# Patient Record
Sex: Female | Born: 1990 | Race: White | Hispanic: No | Marital: Married | Smoking: Never smoker
Health system: Southern US, Community
[De-identification: ages and names within clinical notes are randomized; demographics above are authoritative.]

## PROBLEM LIST (undated history)

## (undated) DIAGNOSIS — J45909 Unspecified asthma, uncomplicated: Secondary | ICD-10-CM

## (undated) HISTORY — DX: Unspecified asthma, uncomplicated: J45.909

---
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
## 2011-03-04 ENCOUNTER — Encounter (HOSPITAL_COMMUNITY): Payer: Self-pay

## 2011-03-04 ENCOUNTER — Emergency Department (HOSPITAL_COMMUNITY): Payer: Medicaid Other

## 2011-03-04 ENCOUNTER — Emergency Department (HOSPITAL_COMMUNITY)
Admission: EM | Admit: 2011-03-04 | Discharge: 2011-03-04 | Disposition: A | Discharge status: Home / Self Care | Payer: Medicaid Other | Attending: Emergency Medicine | Admitting: Emergency Medicine

## 2011-03-04 DIAGNOSIS — T148XXA Other injury of unspecified body region, initial encounter: Secondary | ICD-10-CM | POA: Insufficient documentation

## 2011-03-04 DIAGNOSIS — Y9372 Activity, wrestling: Secondary | ICD-10-CM | POA: Insufficient documentation

## 2011-03-04 DIAGNOSIS — W219XXA Striking against or struck by unspecified sports equipment, initial encounter: Secondary | ICD-10-CM | POA: Insufficient documentation

## 2011-03-04 DIAGNOSIS — M549 Dorsalgia, unspecified: Secondary | ICD-10-CM | POA: Insufficient documentation

## 2011-03-04 MED ORDER — MELOXICAM 7.5 MG PO TABS: ORAL_TABLET | ORAL | Status: DC

## 2011-03-04 MED ORDER — METHOCARBAMOL 500 MG PO TABS: ORAL_TABLET | ORAL | Status: DC

## 2011-03-04 NOTE — ED Notes (Signed)
Wrestling w. Female friend appox. 2 hours ago, now having back pain

## 2011-03-04 NOTE — ED Notes (Signed)
Exam by Loney Laurence PA.  NAD, alert,

## 2011-04-04 NOTE — ED Provider Notes (Signed)
History     CSN: [id number]  Arrival date & time 03/04/11  1328   None     Chief Complaint  Patient presents with  . Back Pain    (Consider location/radiation/quality/duration/timing/severity/associated sxs/prior treatment) Patient is a 21 y.o. female presenting with back pain. The history is provided by the patient.  Back Pain  This is a new problem. The current episode started 1 to 2 hours ago. The problem occurs constantly. The problem has not changed since onset.Associated with: Wrestling. The pain is present in the thoracic spine. The quality of the pain is described as shooting and aching. The pain does not radiate. The pain is severe. The symptoms are aggravated by bending, twisting and certain positions. The pain is the same all the time. Pertinent negatives include no chest pain, no numbness, no abdominal pain, no bowel incontinence, no perianal numbness, no bladder incontinence, no dysuria and no paresthesias. She has tried nothing for the symptoms.    History reviewed. No pertinent past medical history.  History reviewed. No pertinent past surgical history.  No family history on file.  History  Substance Use Topics  . Smoking status: Never Smoker   . Smokeless tobacco: Not on file  . Alcohol Use: No    OB History    Grav Para Term Preterm Abortions TAB SAB Ect Mult Living                  Review of Systems  Constitutional: Negative for activity change.       All ROS Neg except as noted in HPI  HENT: Negative for nosebleeds and neck pain.   Eyes: Negative for photophobia and discharge.  Respiratory: Negative for cough, shortness of breath and wheezing.   Cardiovascular: Negative for chest pain and palpitations.  Gastrointestinal: Negative for abdominal pain, blood in stool and bowel incontinence.  Genitourinary: Negative for bladder incontinence, dysuria, frequency and hematuria.  Musculoskeletal: Positive for back pain. Negative for arthralgias.  Skin:  Negative.   Neurological: Negative for dizziness, seizures, speech difficulty, numbness and paresthesias.  Psychiatric/Behavioral: Negative for hallucinations and confusion.    Allergies  Review of patient's allergies indicates no known allergies.  Home Medications   Current Outpatient Rx  Name Route Sig Dispense Refill  . NORGESTIMATE-ETH ESTRADIOL 0.25-35 MG-MCG PO TABS Oral Take 1 tablet by mouth every evening. At 8pm    . MELOXICAM 7.5 MG PO TABS  1 po bid with food 14 tablet 0  . METHOCARBAMOL 500 MG PO TABS  2 po tid for spasm/pain 30 tablet 0    BP 127/81  Pulse 83  Temp(Src) 98.4 F (36.9 C) (Oral)  Resp 20  Ht 5\' 3"  (1.6 m)  Wt 143 lb (64.864 kg)  BMI 25.33 kg/m2  SpO2 100%  LMP 02/28/2011  Physical Exam  Nursing note and vitals reviewed. Constitutional: She is oriented to person, place, and time. She appears well-developed and well-nourished.  Non-toxic appearance.  HENT:  Head: Normocephalic.  Right Ear: Tympanic membrane and external ear normal.  Left Ear: Tympanic membrane and external ear normal.  Eyes: EOM and lids are normal. Pupils are equal, round, and reactive to light.  Neck: Normal range of motion. Neck supple. Carotid bruit is not present.  Cardiovascular: Normal rate, regular rhythm, normal heart sounds, intact distal pulses and normal pulses.   Pulmonary/Chest: Breath sounds normal. No respiratory distress.  Abdominal: Soft. Bowel sounds are normal. There is no tenderness. There is no guarding.  Musculoskeletal: Normal range  of motion.       Thoracic area pain to ROM and palpation. No palpable ddeformity  Lymphadenopathy:       Head (right side): No submandibular adenopathy present.       Head (left side): No submandibular adenopathy present.    She has no cervical adenopathy.  Neurological: She is alert and oriented to person, place, and time. She has normal strength. No cranial nerve deficit or sensory deficit. She exhibits normal muscle tone.  Coordination normal.       Gait wnl  Skin: Skin is warm and dry.  Psychiatric: She has a normal mood and affect. Her speech is normal.    ED Course  Procedures (including critical care time)  Labs Reviewed - No data to display No results found.   1. Muscle strain       MDM  I have reviewed nursing notes, vital signs, and all appropriate lab and imaging results for this patient. Thoracic spine xray neg for fracture or dislocation. No gross neuro deficit. Rx for Mobic and Robaxin given. Pt to see orthopedics is not improving.  No results found for this or any previous visit. No results found.  Medical screening examination/treatment/procedure(s) were performed by non-physician practitioner and as supervising physician I was immediately available for consultation/collaboration. Osvaldo Human, M.D.     Kathie Dike, [place] 04/04/11 1119  Carleene Cooper III, MD 04/09/11 [phone]

## 2011-08-20 ENCOUNTER — Other Ambulatory Visit: Payer: Self-pay | Admitting: Family Medicine

## 2011-08-20 ENCOUNTER — Ambulatory Visit (HOSPITAL_COMMUNITY)
Admission: RE | Admit: 2011-08-20 | Discharge: 2011-08-20 | Disposition: A | Discharge status: Home / Self Care | Payer: Medicaid Other | Source: Ambulatory Visit | Attending: Family Medicine | Admitting: Family Medicine

## 2011-08-20 DIAGNOSIS — R109 Unspecified abdominal pain: Secondary | ICD-10-CM

## 2011-08-20 DIAGNOSIS — N201 Calculus of ureter: Secondary | ICD-10-CM | POA: Insufficient documentation

## 2011-08-20 DIAGNOSIS — N1330 Unspecified hydronephrosis: Secondary | ICD-10-CM | POA: Insufficient documentation

## 2012-05-25 ENCOUNTER — Ambulatory Visit (INDEPENDENT_AMBULATORY_CARE_PROVIDER_SITE_OTHER): Payer: Medicaid Other | Admitting: Family Medicine

## 2012-05-25 ENCOUNTER — Encounter: Payer: Self-pay | Admitting: Family Medicine

## 2012-05-25 VITALS — Temp 98.4°F | Wt 167.0 lb

## 2012-05-25 DIAGNOSIS — J209 Acute bronchitis, unspecified: Secondary | ICD-10-CM

## 2012-05-25 MED ORDER — BENZONATATE 100 MG PO CAPS: 100.0000 mg | ORAL_CAPSULE | Freq: Four times a day (QID) | ORAL | Status: DC | PRN

## 2012-05-25 MED ORDER — CLARITHROMYCIN 500 MG PO TABS: 500.0000 mg | ORAL_TABLET | Freq: Two times a day (BID) | ORAL | Status: AC

## 2012-05-25 NOTE — Progress Notes (Signed)
  Subjective:    Patient ID: Kimberly Everett, female    DOB: 04-27-1990, 22 y.o.   MRN: [id number]  Cough This is a new problem. The current episode started in the past 7 days. The problem has been gradually worsening. The problem occurs every few minutes. The cough is productive of sputum. Associated symptoms include chills, nasal congestion and rhinorrhea. Pertinent negatives include no fever or headaches. Nothing aggravates the symptoms. She has tried OTC cough suppressant for the symptoms. The treatment provided no relief. Her past medical history is significant for asthma.    Diminished energy. Achy at times.  Review of Systems  Constitutional: Positive for chills. Negative for fever.  HENT: Positive for rhinorrhea.   Respiratory: Positive for cough.   Neurological: Negative for headaches.   Dec energy    Objective:   Physical Exam  Alert mild malaise. Vitals reviewed. HEENT moderate nasal congestion. Pharynx normal neck supple. Lungs some bronchial cough during exam heart regular rate and rhythm.      Assessment & Plan:  Impression rhinitis bronchitis. Plan antibiotics prescribed. Cough suppression prescribed. Symptomatic care discussed. WSL

## 2012-06-27 ENCOUNTER — Encounter: Payer: Self-pay | Admitting: *Deleted

## 2012-07-09 ENCOUNTER — Ambulatory Visit (INDEPENDENT_AMBULATORY_CARE_PROVIDER_SITE_OTHER): Payer: Medicaid Other | Admitting: Nurse Practitioner

## 2012-07-09 ENCOUNTER — Encounter: Payer: Self-pay | Admitting: Nurse Practitioner

## 2012-07-09 VITALS — BP 123/78 | HR 80 | Wt 164.8 lb

## 2012-07-09 DIAGNOSIS — F32A Depression, unspecified: Secondary | ICD-10-CM | POA: Insufficient documentation

## 2012-07-09 DIAGNOSIS — F329 Major depressive disorder, single episode, unspecified: Secondary | ICD-10-CM

## 2012-07-09 MED ORDER — ESCITALOPRAM OXALATE 20 MG PO TABS: 20.0000 mg | ORAL_TABLET | Freq: Every day | ORAL | Status: DC

## 2012-07-09 NOTE — Assessment & Plan Note (Signed)
Meds ordered this encounter  Medications  . escitalopram (LEXAPRO) 20 MG tablet    Sig: Take 1 tablet (20 mg total) by mouth daily.    Dispense:  30 tablet    Refill:  2    Order Specific Question:  Supervising Provider    Answer:  Merlyn Albert [[id number]]   Stop Celexa and switch to Lexapro. Do not take Celexa tonight, may start Lexapro tomorrow. Cautioned about potential adverse effects of changing medication. DC med and go back to Celexa and call if any severe side effects. Otherwise recheck in 3 months.

## 2012-07-09 NOTE — Progress Notes (Signed)
Subjective:  Presents for routine followup. Despite all feeding and getting regular exercise patient has slowed 20 pound weight gain since December when she started Celexa. Celexa is working well for her emotionally but is concerned about excessive weight gain.  Objective:   BP 123/78  Pulse 80  Wt 164 lb 12.8 oz (74.753 kg)  BMI 29.2 kg/m2  LMP 06/29/2012 NAD. Alert, oriented. Lungs clear. Heart regular rate rhythm.  Assessment:Depression  Plan: Meds ordered this encounter  Medications  . escitalopram (LEXAPRO) 20 MG tablet    Sig: Take 1 tablet (20 mg total) by mouth daily.    Dispense:  30 tablet    Refill:  2    Order Specific Question:  Supervising Provider    Answer:  Merlyn Albert [[id number]]   Stop Celexa and switch to Lexapro. Do not take Celexa tonight, may start Lexapro tomorrow. Cautioned about potential adverse effects of changing medication. DC med and go back to Celexa and call if any severe side effects. Otherwise recheck in 3 months.

## 2012-07-27 ENCOUNTER — Encounter: Payer: Self-pay | Admitting: Family Medicine

## 2012-07-27 ENCOUNTER — Ambulatory Visit (INDEPENDENT_AMBULATORY_CARE_PROVIDER_SITE_OTHER): Payer: Medicaid Other | Admitting: Family Medicine

## 2012-07-27 VITALS — BP 124/96 | Temp 98.3°F | Wt 166.0 lb

## 2012-07-27 DIAGNOSIS — N200 Calculus of kidney: Secondary | ICD-10-CM

## 2012-07-27 DIAGNOSIS — R300 Dysuria: Secondary | ICD-10-CM

## 2012-07-27 LAB — POCT URINALYSIS DIPSTICK
Spec Grav, UA: 1.02
pH, UA: 5

## 2012-07-27 MED ORDER — ONDANSETRON 4 MG PO TBDP
4.0000 mg | ORAL_TABLET | Freq: Three times a day (TID) | ORAL | Status: DC | PRN
Start: 2012-07-27 — End: 2012-11-11

## 2012-07-27 NOTE — Progress Notes (Signed)
  Subjective:    Patient ID: Kimberly Everett, female    DOB: 1990/05/27, 22 y.o.   MRN: [id number]  HPI Patient arrives office with left flank pain. Severe in nature. Colicky in nature. Radiates to left lateral abdomen. And somewhat to groin. Vomiting x1. Pain quite severe times. Took only Tylenol. Had no other medications. Trying to drink more liquids. Prior medical history positive for kidney stone last year. Never got the stone for a sample. No chest pain no change in bowel habits. Some dysuria no fever ROS otherwise negative   Review of Systems See above    Objective:   Physical Exam  Alert some distress. Vitals reviewed. Lungs clear. Heart regular in rhythm. Positive CVA tenderness. Positive left lateral abdominal tenderness. Good bowel sounds no rebound no guarding.  UA numerous red blood cells and occasional oxalate crystal    Assessment & Plan:

## 2012-07-27 NOTE — Patient Instructions (Addendum)
Low oxalate diet for probable calcium oxalate  Stones  Be sure to use ibuprofen three tabs three times per day

## 2012-08-31 ENCOUNTER — Encounter: Payer: Self-pay | Admitting: Family Medicine

## 2012-08-31 ENCOUNTER — Ambulatory Visit (INDEPENDENT_AMBULATORY_CARE_PROVIDER_SITE_OTHER): Payer: Managed Care, Other (non HMO) | Admitting: Family Medicine

## 2012-08-31 VITALS — BP 110/90 | HR 88 | Temp 99.1°F | Ht 63.0 in | Wt 169.6 lb

## 2012-08-31 DIAGNOSIS — N912 Amenorrhea, unspecified: Secondary | ICD-10-CM

## 2012-08-31 DIAGNOSIS — N915 Oligomenorrhea, unspecified: Secondary | ICD-10-CM

## 2012-08-31 DIAGNOSIS — R300 Dysuria: Secondary | ICD-10-CM

## 2012-08-31 MED ORDER — CIPROFLOXACIN HCL 500 MG PO TABS: 500.0000 mg | ORAL_TABLET | Freq: Two times a day (BID) | ORAL | Status: AC

## 2012-08-31 MED ORDER — ONDANSETRON HCL 8 MG PO TABS: 8.0000 mg | ORAL_TABLET | Freq: Three times a day (TID) | ORAL | Status: DC | PRN

## 2012-08-31 NOTE — Progress Notes (Signed)
  Subjective:    Patient ID: Kimberly Everett, female    DOB: 12-26-1990, 22 y.o.   MRN: [id number]  HPI Patient been feeling dizzy and nausea. Patient also states that she has been having side pain Patient relates she was diagnosed with kidney stone while back she wonders if she may have another intermittent pain and discomfort she denies vomiting diarrhea dysuria. Denies high fever or chills. PMH kidney stone. She does state that she needs refills on birth control pill. Family history benign Review of Systems See above    Objective:   Physical Exam Lungs are clear hearts regular pulse normal blood pressure is good Abdomen is soft with some subjective discomfort on the left side no guarding or rebound  Urinalysis difficult to interpret do to use of Pyridium    Assessment & Plan:  Left side abdominal discomfort urinary symptoms culture urine, Cipro 7 days as directed, if high fevers vomiting or worse followup immediately.

## 2012-09-25 ENCOUNTER — Other Ambulatory Visit: Payer: Self-pay | Admitting: Nurse Practitioner

## 2012-09-25 ENCOUNTER — Telehealth: Payer: Self-pay | Admitting: Nurse Practitioner

## 2012-09-25 MED ORDER — ESCITALOPRAM OXALATE 20 MG PO TABS: 20.0000 mg | ORAL_TABLET | Freq: Every day | ORAL | Status: DC

## 2012-09-25 NOTE — Telephone Encounter (Signed)
Pt would like a refill on her    escitalopram (LEXAPRO) 20 MG tablet   She states she if feeling good on this meds and has a follow up appt on the 15th of Sept.   [facility] [place]

## 2012-10-12 ENCOUNTER — Encounter: Payer: Self-pay | Admitting: Nurse Practitioner

## 2012-10-12 ENCOUNTER — Ambulatory Visit (INDEPENDENT_AMBULATORY_CARE_PROVIDER_SITE_OTHER): Payer: Managed Care, Other (non HMO) | Admitting: Nurse Practitioner

## 2012-10-12 VITALS — BP 138/94 | Ht 63.0 in | Wt 169.4 lb

## 2012-10-12 DIAGNOSIS — F411 Generalized anxiety disorder: Secondary | ICD-10-CM

## 2012-10-12 DIAGNOSIS — F419 Anxiety disorder, unspecified: Secondary | ICD-10-CM

## 2012-10-12 DIAGNOSIS — G4700 Insomnia, unspecified: Secondary | ICD-10-CM

## 2012-10-12 DIAGNOSIS — F329 Major depressive disorder, single episode, unspecified: Secondary | ICD-10-CM

## 2012-10-12 MED ORDER — TRAZODONE HCL 50 MG PO TABS: 50.0000 mg | ORAL_TABLET | Freq: Every day | ORAL | Status: DC

## 2012-10-13 ENCOUNTER — Encounter: Payer: Self-pay | Admitting: Nurse Practitioner

## 2012-10-13 DIAGNOSIS — G4700 Insomnia, unspecified: Secondary | ICD-10-CM | POA: Insufficient documentation

## 2012-10-13 DIAGNOSIS — F419 Anxiety disorder, unspecified: Secondary | ICD-10-CM | POA: Insufficient documentation

## 2012-10-13 NOTE — Progress Notes (Signed)
Subjective:  Presents for routine followup. Doing well on her Lexapro. Her main concern is she continues to have trouble sleeping. Mainly has trouble going to sleep. Denies any adverse affects on Lexapro. Has tried Benadryl and melatonin with no improvement. Minimal caffeine intake. Denies any OTC stimulant.  Objective:   BP 138/94  Ht 5\' 3"  (1.6 m)  Wt 169 lb 6.4 oz (76.839 kg)  BMI 30.02 kg/m2 NAD. Alert, oriented. Cheerful affect. Lungs clear. Heart regular rate rhythm.  Assessment:Depression  Anxiety  Insomnia  Plan: Meds ordered this encounter  Medications  . traZODone (DESYREL) 50 MG tablet    Sig: Take 1 tablet (50 mg total) by mouth at bedtime.    Dispense:  30 tablet    Refill:  2    Order Specific Question:  Supervising Provider    Answer:  Riccardo Dubin   discussed importance of regular exercise and stress reduction. Continue Lexapro as directed. Start with trazodone 50 mg half tab by mouth each bedtime, increase to one tablet if needed. DC med and call if any adverse effects. Otherwise recheck in a few months.

## 2012-10-13 NOTE — Assessment & Plan Note (Signed)
Medications  . traZODone (DESYREL) 50 MG tablet    Sig: Take 1 tablet (50 mg total) by mouth at bedtime.    Dispense:  30 tablet    Refill:  2    Order Specific Question:  Supervising Provider    Answer:  Riccardo Dubin   discussed importance of regular exercise and stress reduction. Continue Lexapro as directed. Start with trazodone 50 mg half tab by mouth each bedtime, increase to one tablet if needed. DC med and call if any adverse effects. Otherwise recheck in a few months.

## 2012-10-13 NOTE — Assessment & Plan Note (Signed)
Medications  . traZODone (DESYREL) 50 MG tablet    Sig: Take 1 tablet (50 mg total) by mouth at bedtime.    Dispense:  30 tablet    Refill:  2    Order Specific Question:  Supervising Provider    Answer:  LUKING, WILLIAM S [[id number]]   discussed importance of regular exercise and stress reduction. Continue Lexapro as directed. Start with trazodone 50 mg half tab by mouth each bedtime, increase to one tablet if needed. DC med and call if any adverse effects. Otherwise recheck in a few months. 

## 2012-11-03 ENCOUNTER — Telehealth: Payer: Self-pay | Admitting: Family Medicine

## 2012-11-03 ENCOUNTER — Other Ambulatory Visit: Payer: Self-pay | Admitting: *Deleted

## 2012-11-03 MED ORDER — TRAZODONE HCL 50 MG PO TABS: 50.0000 mg | ORAL_TABLET | Freq: Every day | ORAL | Status: DC

## 2012-11-03 MED ORDER — ESCITALOPRAM OXALATE 20 MG PO TABS: 20.0000 mg | ORAL_TABLET | Freq: Every day | ORAL | Status: DC

## 2012-11-03 NOTE — Telephone Encounter (Signed)
Scripts ready for pickup. Pt notified on voicemail.  

## 2012-11-03 NOTE — Telephone Encounter (Signed)
Patient would like paper prescriptions for Anexity and Depression medications to send to mail order.  Please call Patient. Thanks

## 2012-11-03 NOTE — Telephone Encounter (Signed)
Last seen 10/13/2012 by carolyn. Her note stated to follow up in a few months. Pt wants 90 day supply of lexapro 20mg  one qd and trazodone 50mg  one at bedtime. She would like written script to pick up and mail in for mail order.

## 2012-11-03 NOTE — Telephone Encounter (Signed)
?  what meds ?last seen? Follow up?

## 2012-11-03 NOTE — Telephone Encounter (Signed)
Ok 90 d plus one ref

## 2012-11-11 ENCOUNTER — Encounter: Payer: Self-pay | Admitting: Family Medicine

## 2012-11-11 ENCOUNTER — Encounter: Payer: Self-pay | Admitting: Nurse Practitioner

## 2012-11-11 ENCOUNTER — Ambulatory Visit (INDEPENDENT_AMBULATORY_CARE_PROVIDER_SITE_OTHER): Payer: Managed Care, Other (non HMO) | Admitting: Nurse Practitioner

## 2012-11-11 VITALS — BP 120/96 | Temp 98.7°F | Ht 63.0 in | Wt 172.6 lb

## 2012-11-11 DIAGNOSIS — R319 Hematuria, unspecified: Secondary | ICD-10-CM

## 2012-11-11 DIAGNOSIS — N200 Calculus of kidney: Secondary | ICD-10-CM

## 2012-11-11 DIAGNOSIS — R300 Dysuria: Secondary | ICD-10-CM

## 2012-11-11 LAB — POCT URINALYSIS DIPSTICK: Spec Grav, UA: 1.02

## 2012-11-11 LAB — POCT UA - MICROSCOPIC ONLY
Casts, Ur, LPF, POC: 0
Crystals, Ur, HPF, POC: 0
Mucus, UA: 0
Mucus, UA: 0
RBC, urine, microscopic: 10
WBC, Ur, HPF, POC: 0

## 2012-11-11 MED ORDER — ONDANSETRON 8 MG PO TBDP: 8.0000 mg | ORAL_TABLET | Freq: Three times a day (TID) | ORAL | Status: DC | PRN

## 2012-11-11 MED ORDER — SULFAMETHOXAZOLE-TMP DS 800-160 MG PO TABS: 1.0000 | ORAL_TABLET | Freq: Two times a day (BID) | ORAL | Status: DC

## 2012-11-11 MED ORDER — HYDROCODONE-ACETAMINOPHEN 5-325 MG PO TABS: 1.0000 | ORAL_TABLET | ORAL | Status: DC | PRN

## 2012-11-12 ENCOUNTER — Encounter: Payer: Self-pay | Admitting: Nurse Practitioner

## 2012-11-12 NOTE — Progress Notes (Signed)
Subjective:  Presents for complaints of possible kidney stone. Has had one of these before. See previous notes 08/20/2011. Had a CT scan at that time. Has had some off-and-on pain in the left CVA/flank area bearable for the past week. This morning began having severe colicky pain in the flank area with several episodes of vomiting. No fever. Slight burning with urination. No urgency or frequency. No obvious blood. Taking fluids well. Has eased off some at this point.  Objective:   BP 120/96  Temp(Src) 98.7 F (37.1 C) (Oral)  Ht 5\' 3"  (1.6 m)  Wt 172 lb 9.6 oz (78.291 kg)  BMI 30.58 kg/m2 NAD. Alert, oriented. Lungs clear. Heart regular rate rhythm. Mild right CVA tenderness into the flank area, more tenderness towards the right middle abdomen just past the anterior axillary line. No pelvic area tenderness. Urine microscopic greater than 10 RBCs per HPF. Rare bacteria and rare epithelial cells.  Assessment:Dysuria - Plan: POCT urinalysis dipstick, POCT UA - Microscopic Only  Kidney stone  Hematuria - Plan: POCT UA - Microscopic Only  Plan: Meds ordered this encounter  Medications  . HYDROcodone-acetaminophen (NORCO/VICODIN) 5-325 MG per tablet    Sig: Take 1 tablet by mouth every 4 (four) hours as needed for pain.    Dispense:  30 tablet    Refill:  0    Order Specific Question:  Supervising Provider    Answer:  Merlyn Albert [[id number]]  . ondansetron (ZOFRAN-ODT) 8 MG disintegrating tablet    Sig: Take 1 tablet (8 mg total) by mouth every 8 (eight) hours as needed for nausea.    Dispense:  20 tablet    Refill:  0    Order Specific Question:  Supervising Provider    Answer:  Merlyn Albert [[id number]]  . sulfamethoxazole-trimethoprim (BACTRIM DS) 800-160 MG per tablet    Sig: Take 1 tablet by mouth 2 (two) times daily. For bladder infection    Dispense:  14 tablet    Refill:  0    Order Specific Question:  Supervising Provider    Answer:  Merlyn Albert [[id number]]    Anti-inflammatories as directed. Increase clear fluid intake. Also given prescription for urine strainer. Call back in 48 hours if no improvement, sooner if worse.

## 2012-11-12 NOTE — Assessment & Plan Note (Signed)
Meds ordered this encounter  Medications  . HYDROcodone-acetaminophen (NORCO/VICODIN) 5-325 MG per tablet    Sig: Take 1 tablet by mouth every 4 (four) hours as needed for pain.    Dispense:  30 tablet    Refill:  0    Order Specific Question:  Supervising Provider    Answer:  Merlyn Albert [[id number]]  . ondansetron (ZOFRAN-ODT) 8 MG disintegrating tablet    Sig: Take 1 tablet (8 mg total) by mouth every 8 (eight) hours as needed for nausea.    Dispense:  20 tablet    Refill:  0    Order Specific Question:  Supervising Provider    Answer:  Merlyn Albert [[id number]]  . sulfamethoxazole-trimethoprim (BACTRIM DS) 800-160 MG per tablet    Sig: Take 1 tablet by mouth 2 (two) times daily. For bladder infection    Dispense:  14 tablet    Refill:  0    Order Specific Question:  Supervising Provider    Answer:  Merlyn Albert [[id number]]   Anti-inflammatories as directed. Increase clear fluid intake. Also given prescription for urine strainer. Call back in 48 hours if no improvement, sooner if worse.

## 2012-11-20 ENCOUNTER — Ambulatory Visit (INDEPENDENT_AMBULATORY_CARE_PROVIDER_SITE_OTHER): Payer: Managed Care, Other (non HMO) | Admitting: *Deleted

## 2012-11-20 DIAGNOSIS — Z23 Encounter for immunization: Secondary | ICD-10-CM

## 2012-12-08 ENCOUNTER — Encounter: Payer: Self-pay | Admitting: Family Medicine

## 2012-12-08 ENCOUNTER — Ambulatory Visit (INDEPENDENT_AMBULATORY_CARE_PROVIDER_SITE_OTHER): Payer: Managed Care, Other (non HMO) | Admitting: Family Medicine

## 2012-12-08 VITALS — BP 112/78 | Temp 98.4°F | Ht 63.0 in | Wt 173.4 lb

## 2012-12-08 DIAGNOSIS — J329 Chronic sinusitis, unspecified: Secondary | ICD-10-CM

## 2012-12-08 MED ORDER — AZITHROMYCIN 250 MG PO TABS: ORAL_TABLET | ORAL | Status: DC

## 2012-12-08 NOTE — Progress Notes (Signed)
  Subjective:    Patient ID: Kimberly Everett, female    DOB: [birth year], 22 y.o.   MRN: [id number]  URI  This is a new problem. The current episode started in the past 7 days. The problem has been unchanged. The maximum temperature recorded prior to her arrival was 100 - 100.9 F. Associated symptoms include congestion, coughing and a sore throat. Associated symptoms comments: Body aches. She has tried decongestant and acetaminophen for the symptoms. The treatment provided no relief.   achey al over, voice hoarse  Non prod cough, hurts Nonsmoker  No GI syptom  tok tyl cold, had cough med   Review of Systems  HENT: Positive for congestion and sore throat.   Respiratory: Positive for cough.        Objective:   Physical Exam  Alert good hydration. HEENT moderate nasal congestion frontal tenderness. Pharynx erythematous neck supple lungs no wheezes no crackles no tachypnea heart rare rhythm.      Assessment & Plan:  Impression rhinitis with secondary pharyngitis. Plan Z-Pak. Symptomatic care discussed. WSL

## 2012-12-14 ENCOUNTER — Telehealth: Payer: Self-pay | Admitting: Family Medicine

## 2012-12-14 ENCOUNTER — Other Ambulatory Visit: Payer: Self-pay | Admitting: Nurse Practitioner

## 2012-12-14 MED ORDER — SULFAMETHOXAZOLE-TMP DS 800-160 MG PO TABS: 1.0000 | ORAL_TABLET | Freq: Two times a day (BID) | ORAL | Status: DC

## 2012-12-14 MED ORDER — HYDROCODONE-ACETAMINOPHEN 5-325 MG PO TABS: 1.0000 | ORAL_TABLET | ORAL | Status: DC | PRN

## 2012-12-14 NOTE — Telephone Encounter (Signed)
Will send in Rx for both this time, but needs to be seen by end of week if no better, sooner if worse. Thanks.

## 2012-12-14 NOTE — Telephone Encounter (Signed)
Last seen for kidney stones on 11/11/12 by Eber Jones.

## 2012-12-14 NOTE — Telephone Encounter (Signed)
Patient states that she is experiencing urgency, pressure, dysuria and brown tint to urine after wiping. No other symptoms. She wants an antibiotic and pain med sent in.

## 2012-12-14 NOTE — Telephone Encounter (Signed)
Notified patient that RX was sent in to pharmacy and Rx for pain med was ready for pickup. Patient verbalized understanding.

## 2012-12-14 NOTE — Telephone Encounter (Signed)
Patient is having symptoms of feeling like she has to use the bathroom, pain in kidney area. She has been seen recently for kidney stones and she would like something called in for this.     [facility]

## 2012-12-14 NOTE — Telephone Encounter (Signed)
Please call and get more info such as symptoms. Fever? Hematuria? Pain? What does she need called in? Pain med? Nausea med?

## 2012-12-28 ENCOUNTER — Telehealth: Payer: Self-pay | Admitting: Family Medicine

## 2012-12-28 NOTE — Telephone Encounter (Signed)
Unable to hold down food, lot of pain, back to the er plus pain meds fluids and appropriate tests

## 2012-12-28 NOTE — Telephone Encounter (Signed)
Notified patient unable to hold down food, lot of pain, back to the er plus pain meds fluids and appropriate tests. Patient verbalized understanding.

## 2012-12-28 NOTE — Telephone Encounter (Signed)
Left message on voicemail to return call ASAP.

## 2012-12-28 NOTE — Telephone Encounter (Signed)
Pt has had kidney stones, kidney infection and fluid in her kidney..the patient was told that she she was to have a CT & Polygram by the [facility],  at the urologist today but did do it on an stat ( hosp didn't send pts info over to urologist)  pts husband called Dr at [facility] back after this appt an explained they didn't get what was told by [facility] would be done today. So the Dr there told him to call our office to see if we can get this done asap instead of going thru the ER. Pt not sure which would be quicker to do... [place] who have to get a prior approval or go ahead an go back to [facility] ER   Please call pt to advise, she is not able to hold down food an in a lot of pain

## 2013-01-11 ENCOUNTER — Ambulatory Visit (INDEPENDENT_AMBULATORY_CARE_PROVIDER_SITE_OTHER): Payer: Managed Care, Other (non HMO) | Admitting: Nurse Practitioner

## 2013-01-11 ENCOUNTER — Encounter: Payer: Self-pay | Admitting: Nurse Practitioner

## 2013-01-11 VITALS — BP 142/80 | Ht 63.0 in | Wt 171.0 lb

## 2013-01-11 DIAGNOSIS — F3289 Other specified depressive episodes: Secondary | ICD-10-CM

## 2013-01-11 DIAGNOSIS — F329 Major depressive disorder, single episode, unspecified: Secondary | ICD-10-CM

## 2013-01-11 DIAGNOSIS — N912 Amenorrhea, unspecified: Secondary | ICD-10-CM

## 2013-01-11 DIAGNOSIS — F411 Generalized anxiety disorder: Secondary | ICD-10-CM

## 2013-01-11 DIAGNOSIS — F419 Anxiety disorder, unspecified: Secondary | ICD-10-CM

## 2013-01-11 LAB — HCG, QUANTITATIVE, PREGNANCY: hCG, Beta Chain, Quant, S: 1756 m[IU]/mL

## 2013-01-14 ENCOUNTER — Encounter: Payer: Self-pay | Admitting: Nurse Practitioner

## 2013-01-14 NOTE — Progress Notes (Signed)
Subjective:  Presents to discuss medications. Stopped all medications about 2 weeks ago. Thinks she is approximately [redacted] weeks pregnant but not sure. Noticed a flare up of her depression and anxiety symptoms since stopping Lexapro.  Objective:   BP 142/80  Ht 5\' 3"  (1.6 m)  Wt 171 lb (77.565 kg)  BMI 30.30 kg/m2  LMP 06/29/2012 NAD. Alert, active. Lungs clear. Heart RRR.  Assessment: Anxiety  Depression  Amenorrhea - Plan: B-HCG Quant  Plan: patient has prenatal appointment with OB in January. To call her OB about restarting Lexapro. Recheck here as needed.

## 2013-02-22 ENCOUNTER — Ambulatory Visit (INDEPENDENT_AMBULATORY_CARE_PROVIDER_SITE_OTHER): Payer: Managed Care, Other (non HMO) | Admitting: Family Medicine

## 2013-02-22 ENCOUNTER — Encounter: Payer: Self-pay | Admitting: Family Medicine

## 2013-02-22 VITALS — BP 112/80 | Temp 98.8°F | Ht 63.0 in | Wt 169.0 lb

## 2013-02-22 DIAGNOSIS — J329 Chronic sinusitis, unspecified: Secondary | ICD-10-CM

## 2013-02-22 MED ORDER — AZITHROMYCIN 250 MG PO TABS
ORAL_TABLET | ORAL | Status: DC
Start: 2013-02-22 — End: 2013-07-01

## 2013-02-22 NOTE — Progress Notes (Signed)
   Subjective:    Patient ID: Chriss DriverElizabeth M Bullins, female    DOB: Sep 12, 1990, 23 y.o.   MRN: [id number]  HPIPatient is [redacted] weeks pregnant.   Cough non productive  Taking otc allergy med  No sig headache, ears hurting and aching  Got a flu shot  Having cough, body aches, runny nose. Started last Thursday.     Review of Systems No headache no chest pain no back pain no change in bowel habits no blood in stool ROS otherwise negative    Objective:   Physical Exam  Alert mild malaise. H&T normal. Lungs rare rhonchi no wheezes no crackles no tachypnea heart regular in rhythm.      Assessment & Plan:  Impression acute bronchitis post viral syndrome plan Z-Pak. Robitussin DM. May use it at end of the first trimester discussed. Since Medicare discussed. WSL

## 2013-07-01 ENCOUNTER — Ambulatory Visit (INDEPENDENT_AMBULATORY_CARE_PROVIDER_SITE_OTHER): Payer: Managed Care, Other (non HMO) | Admitting: Nurse Practitioner

## 2013-07-01 ENCOUNTER — Encounter: Payer: Self-pay | Admitting: Nurse Practitioner

## 2013-07-01 VITALS — BP 124/78 | Temp 98.6°F | Ht 63.0 in | Wt 196.0 lb

## 2013-07-01 DIAGNOSIS — R300 Dysuria: Secondary | ICD-10-CM

## 2013-07-01 DIAGNOSIS — R319 Hematuria, unspecified: Secondary | ICD-10-CM

## 2013-07-01 LAB — POCT URINALYSIS DIPSTICK
Blood, UA: 250
PH UA: 7

## 2013-07-01 MED ORDER — CEFDINIR 300 MG PO CAPS: 300.0000 mg | ORAL_CAPSULE | Freq: Two times a day (BID) | ORAL | Status: DC

## 2013-07-02 LAB — POCT UA - MICROSCOPIC ONLY
Bacteria, U Microscopic: NEGATIVE
WBC, Ur, HPF, POC: 0

## 2013-07-04 LAB — URINE CULTURE

## 2013-07-06 ENCOUNTER — Encounter: Payer: Self-pay | Admitting: Nurse Practitioner

## 2013-07-06 NOTE — Progress Notes (Signed)
Subjective:  Presents complaints of urinary symptoms over the past 3 days. Dysuria with slight urgency and frequency. Pregnant, approximately [redacted] weeks gestation. Having pain in the right mid back area towards the pelvic area at times. History of kidney stones, the pain is not that intense. No vaginal discharge. Same sexual partner. No history of recent UTI. No obvious blood in her urine. No fever. Some vomiting. Taking fluids well.  Objective:   BP 124/78  Temp(Src) 98.6 F (37 C) (Oral)  Ht 5\' 3"  (1.6 m)  Wt 196 lb (88.905 kg)  BMI 34.73 kg/m2  LMP 06/29/2012 NAD. Alert, oriented. Lungs clear. Heart regular rate rhythm. Mild right CVA area tenderness. Extends towards the right flank area. Urine microscopic 0-6 RBCs per HPF.   Assessment:Dysuria - Plan: POCT urinalysis dipstick, POCT UA - Microscopic Only, Urine culture  Hematuria - Plan: Urine culture  Plan: Meds ordered this encounter  Medications  . Prenatal Vit-Fe Fumarate-FA (PRENATAL ONE DAILY PO)    Sig: Take by mouth.  . Ferrous Sulfate (IRON) 325 (65 FE) MG TABS    Sig: Take by mouth.  . Ranitidine HCl (ZANTAC PO)    Sig: Take by mouth daily.  . cefdinir (OMNICEF) 300 MG capsule    Sig: Take 1 capsule (300 mg total) by mouth 2 (two) times daily.    Dispense:  14 capsule    Refill:  0    Order Specific Question:  Supervising Provider    Answer:  Merlyn Albert [[id number]]   Urine culture pending. Possibility of early kidney stone. Patient's gynecologist is in [place]. Start antibiotics but recommend recheck with their office if symptoms persist. Call or go to ED sooner if worse.

## 2013-11-23 ENCOUNTER — Ambulatory Visit (INDEPENDENT_AMBULATORY_CARE_PROVIDER_SITE_OTHER): Payer: Managed Care, Other (non HMO) | Admitting: *Deleted

## 2013-11-23 DIAGNOSIS — Z23 Encounter for immunization: Secondary | ICD-10-CM

## 2013-11-29 ENCOUNTER — Encounter: Payer: Self-pay | Admitting: Nurse Practitioner

## 2013-12-21 ENCOUNTER — Ambulatory Visit (INDEPENDENT_AMBULATORY_CARE_PROVIDER_SITE_OTHER): Payer: Managed Care, Other (non HMO) | Admitting: Family Medicine

## 2013-12-21 ENCOUNTER — Encounter: Payer: Self-pay | Admitting: Family Medicine

## 2013-12-21 VITALS — BP 100/70 | Temp 98.3°F | Ht 63.0 in | Wt 184.5 lb

## 2013-12-21 DIAGNOSIS — R21 Rash and other nonspecific skin eruption: Secondary | ICD-10-CM

## 2013-12-21 MED ORDER — CLINDAMYCIN PHOSPHATE 1 % EX SOLN: CUTANEOUS | Status: AC

## 2013-12-21 NOTE — Progress Notes (Signed)
   Subjective:    Patient ID: Kimberly Everett, female    DOB: 1990/04/26, 23 y.o.   MRN: [id number]  Rash This is a new problem. The current episode started in the past 7 days. The problem is unchanged. The affected locations include the face. The rash is characterized by redness. She was exposed to nothing. Past treatments include nothing. The treatment provided no relief.   Patient states that she has no other concerns at this time.  Burns slightly no sig itching  Burns a bit  Not painful  No sig major allergy rxns on face  No hx of makeup rxn etc.\  History of slight patchy rash in the past nothing like this. Next  On further history started contraceptives 2 months ago, but also received a course of steroids last week in the emergency room for back pain. States back pain better Review of Systems  Skin: Positive for rash.   No vomiting no diarrhea no abdominal pain ROS otherwise negative    Objective:   Physical Exam Alert good hydration. HEENT facial eruption noted. Fine tiny bumps some with pustular component. Lungs clear. Heart regular rate and rhythm.       Assessment & Plan:  Impression acneiform rash following steroids discussed plan Cleocin T suspension twice a day to local area. Symptomatic care discussed. WSL

## 2014-08-02 ENCOUNTER — Encounter: Payer: Self-pay | Admitting: Family Medicine

## 2014-08-02 ENCOUNTER — Ambulatory Visit (INDEPENDENT_AMBULATORY_CARE_PROVIDER_SITE_OTHER): Payer: Managed Care, Other (non HMO) | Admitting: Family Medicine

## 2014-08-02 VITALS — BP 138/88 | Temp 97.6°F | Ht 63.0 in | Wt 184.0 lb

## 2014-08-02 DIAGNOSIS — R109 Unspecified abdominal pain: Secondary | ICD-10-CM

## 2014-08-02 LAB — POCT URINALYSIS DIPSTICK
PH UA: 5
Spec Grav, UA: 1.025

## 2014-08-02 MED ORDER — HYDROCODONE-ACETAMINOPHEN 5-325 MG PO TABS: 1.0000 | ORAL_TABLET | Freq: Four times a day (QID) | ORAL | Status: DC | PRN

## 2014-08-02 MED ORDER — ONDANSETRON HCL 4 MG PO TABS: 4.0000 mg | ORAL_TABLET | Freq: Four times a day (QID) | ORAL | Status: DC | PRN

## 2014-08-02 NOTE — Progress Notes (Signed)
   Subjective:    Patient ID: Kimberly DriverElizabeth M Bullins, female    DOB: 10-07-1990, 24 y.o.   MRN: [id number]  Dysuria  This is a new problem. Episode onset: 3 days ago. Associated symptoms include flank pain, frequency and urgency. She has tried acetaminophen for the symptoms. The treatment provided no relief.   Drinking ok, pos sig nausea  No nocturia  Pos dysuris A  Patient does have history of kidney stones. In fact had to have surgery in the past.  Had a pretty difficult we can a lot of pain. Notes overall feeling better at this time.  Still some dysuria.   Review of Systems  Genitourinary: Positive for dysuria, urgency, frequency and flank pain.   No fever no chills    Objective:   Physical Exam  Alert vitals stable lungs clear. Heart regular in rhythm. Positive flank tenderness to palpation abdomen benign. Next  Urinalysis 2-4 red blood cells per high-power field      Assessment & Plan:  Impression probable kidney stone discussed at length plan increase hydration. An inflammatory medicine important discussed. Nausea medicine. Pain medicine when necessary. If gradual improvement great if worsens return for recheck. Seen in after-hours rather than central emergency room [facility]

## 2015-06-30 ENCOUNTER — Encounter: Payer: Self-pay | Admitting: Nurse Practitioner

## 2015-06-30 ENCOUNTER — Ambulatory Visit (INDEPENDENT_AMBULATORY_CARE_PROVIDER_SITE_OTHER): Payer: Managed Care, Other (non HMO) | Admitting: Nurse Practitioner

## 2015-06-30 VITALS — BP 124/84 | Temp 98.7°F | Ht 63.0 in | Wt 187.0 lb

## 2015-06-30 DIAGNOSIS — J209 Acute bronchitis, unspecified: Secondary | ICD-10-CM | POA: Diagnosis not present

## 2015-06-30 DIAGNOSIS — J069 Acute upper respiratory infection, unspecified: Secondary | ICD-10-CM | POA: Diagnosis not present

## 2015-06-30 DIAGNOSIS — K219 Gastro-esophageal reflux disease without esophagitis: Secondary | ICD-10-CM

## 2015-06-30 DIAGNOSIS — B9689 Other specified bacterial agents as the cause of diseases classified elsewhere: Secondary | ICD-10-CM

## 2015-06-30 MED ORDER — AZITHROMYCIN 250 MG PO TABS: ORAL_TABLET | ORAL | Status: DC

## 2015-06-30 MED ORDER — ALBUTEROL SULFATE HFA 108 (90 BASE) MCG/ACT IN AERS: 2.0000 | INHALATION_SPRAY | RESPIRATORY_TRACT | Status: DC | PRN

## 2015-06-30 MED ORDER — PANTOPRAZOLE SODIUM 40 MG PO TBEC: 40.0000 mg | DELAYED_RELEASE_TABLET | Freq: Every day | ORAL | Status: DC

## 2015-06-30 MED ORDER — PREDNISONE 20 MG PO TABS: ORAL_TABLET | ORAL | Status: DC

## 2015-06-30 NOTE — Progress Notes (Signed)
Subjective:  Presents for complaints of sore throat cough and congestion for the past 2 weeks. No fever. Sore throat mainly on the right side. Generalized headache. Runny nose. Frequent nonproductive cough. Chest pain with deep breath or cough. Occasional nausea, no vomiting. No abdominal pain. Has a remote history of exercise-induced asthma, has had more wheezing with illness. Right ear pain. Having acid reflux almost every day. Denies any caffeine tobacco or alcohol use. Is not breast-feeding. Denies pregnancy. Has had some issues with her asthma lately especially with activity.  Objective:   BP 124/84 mmHg  Temp(Src) 98.7 F (37.1 C) (Oral)  Ht  (1.6 m)  Wt 187 lb (84.823 kg)  BMI 33.13 kg/m2 NAD. Alert, oriented. TMs clear effusion, no erythema. Pharynx mildly injected with green PND noted. Neck supple with mild soft anterior adenopathy. Lungs scattered expiratory crackles noted upper lobes posterior. No wheezing or tachypnea. Heart regular rate rhythm. Abdomen soft nondistended with epigastric area tenderness. No rebound or guarding. No obvious masses.  Assessment:  Problem List Items Addressed This Visit      Digestive   Gastroesophageal reflux disease without esophagitis   Relevant Medications   pantoprazole (PROTONIX) 40 MG tablet    Other Visit Diagnoses    Bacterial upper respiratory infection    -  Primary    Relevant Medications    azithromycin (ZITHROMAX Z-PAK) 250 MG tablet    Acute bronchitis, unspecified organism          Plan:  Meds ordered this encounter  Medications  . pantoprazole (PROTONIX) 40 MG tablet    Sig: Take 1 tablet (40 mg total) by mouth daily. Prn acid reflux    Dispense:  30 tablet    Refill:  2    Order Specific Question:  Supervising Provider    Answer:  Merlyn Albert [[id number]]  . azithromycin (ZITHROMAX Z-PAK) 250 MG tablet    Sig: Take 2 tablets (500 mg) on  Day 1,  followed by 1 tablet (250 mg) once daily on Days 2 through 5.   Dispense:  6 each    Refill:  0    Order Specific Question:  Supervising Provider    Answer:  Merlyn Albert [[id number]]  . predniSONE (DELTASONE) 20 MG tablet    Sig: 2 po qd x 5 d    Dispense:  10 tablet    Refill:  0    Order Specific Question:  Supervising Provider    Answer:  Merlyn Albert [[id number]]  . DISCONTD: albuterol (PROVENTIL HFA;VENTOLIN HFA) 108 (90 Base) MCG/ACT inhaler    Sig: Inhale 2 puffs into the lungs every 4 (four) hours as needed.    Dispense:  1 Inhaler    Refill:  0    Order Specific Question:  Supervising Provider    Answer:  Merlyn Albert [[id number]]  . albuterol (PROVENTIL HFA;VENTOLIN HFA) 108 (90 Base) MCG/ACT inhaler    Sig: Inhale 2 puffs into the lungs every 4 (four) hours as needed.    Dispense:  1 Inhaler    Refill:  0    Please dispense ProAir inhaler per insurance formulary; thanks    Order Specific Question:  Supervising Provider    Answer:  Riccardo Dubin    Given prescription for short course of prednisone in case wheezing worsens over the weekend. OTC meds as directed for congestion and cough. Given written and verbal information on reflux disease. Recommend office visit to recheck GERD and to discuss  possible exacerbation of her asthma. Patient to try 2 puffs of albuterol before extreme activity to see if this will alleviate her symptoms. Call back sooner if worse.

## 2015-07-06 ENCOUNTER — Ambulatory Visit: Payer: Managed Care, Other (non HMO) | Admitting: Nurse Practitioner

## 2015-07-14 ENCOUNTER — Ambulatory Visit: Payer: Managed Care, Other (non HMO) | Admitting: Nurse Practitioner

## 2015-08-04 ENCOUNTER — Encounter: Payer: Self-pay | Admitting: Nurse Practitioner

## 2015-08-04 ENCOUNTER — Ambulatory Visit (INDEPENDENT_AMBULATORY_CARE_PROVIDER_SITE_OTHER): Payer: Managed Care, Other (non HMO) | Admitting: Nurse Practitioner

## 2015-08-04 VITALS — BP 114/80 | Temp 97.8°F | Ht 63.0 in | Wt 192.2 lb

## 2015-08-04 DIAGNOSIS — G4700 Insomnia, unspecified: Secondary | ICD-10-CM

## 2015-08-04 DIAGNOSIS — F419 Anxiety disorder, unspecified: Secondary | ICD-10-CM

## 2015-08-04 MED ORDER — ESCITALOPRAM OXALATE 20 MG PO TABS: 20.0000 mg | ORAL_TABLET | Freq: Every day | ORAL | Status: DC

## 2015-08-04 MED ORDER — CLONAZEPAM 0.5 MG PO TABS: ORAL_TABLET | ORAL | Status: DC

## 2015-08-04 MED ORDER — TRAZODONE HCL 50 MG PO TABS: 25.0000 mg | ORAL_TABLET | Freq: Every evening | ORAL | Status: DC | PRN

## 2015-08-04 NOTE — Progress Notes (Signed)
Subjective:  Presents for complaints of panic attacks occurring at least every 2 days. Describes his chest pain dizziness and difficulty breathing. Unassociated with activity. Last 1-2 minutes. Minimal relief with use of albuterol inhaler. No actual wheezing. Minimal cough. No fever. Producing clear mucus. Very emotional. Sleep disturbance. Describes stress level is 8 out of 10. See GAD 7 results. Was on Lexapro and trazodone at one point which worked well. Last taken in 2014. GAD 7 : Generalized Anxiety Score 08/04/2015  Nervous, Anxious, on Edge 3  Control/stop worrying 3  Worry too much - different things 3  Trouble relaxing 3  Restless 3  Easily annoyed or irritable 3  Afraid - awful might happen 3  Total GAD 7 Score 21  Anxiety Difficulty Somewhat difficult     Objective:   BP 114/80 mmHg  Temp(Src) 97.8 F (36.6 C) (Oral)  Ht 5\' 3"  (1.6 m)  Wt 192 lb 4 oz (87.204 kg)  BMI 34.06 kg/m2 NAD. Alert, oriented. Lungs clear. Heart regular rate rhythm. No murmur or gallop noted. Moderately anxious affect. Restless. Very fidgety. Thoughts logical coherent and relevant. Dressed appropriately.  Assessment:  Problem List Items Addressed This Visit      Other   Anxiety - Primary   Relevant Medications   escitalopram (LEXAPRO) 20 MG tablet   traZODone (DESYREL) 50 MG tablet   Insomnia     Plan:  Meds ordered this encounter  Medications  . escitalopram (LEXAPRO) 20 MG tablet    Sig: Take 1 tablet (20 mg total) by mouth daily.    Dispense:  30 tablet    Refill:  2    Order Specific Question:  Supervising Provider    Answer:  Merlyn AlbertLUKING, WILLIAM S [[id number]]  . traZODone (DESYREL) 50 MG tablet    Sig: Take 0.5-1 tablets (25-50 mg total) by mouth at bedtime as needed for sleep.    Dispense:  30 tablet    Refill:  2    Order Specific Question:  Supervising Provider    Answer:  Merlyn AlbertLUKING, WILLIAM S [[id number]]  . clonazePAM (KLONOPIN) 0.5 MG tablet    Sig: 1/2-1 po BID prn anxiety    Dispense:  30  tablet    Refill:  2    Order Specific Question:  Supervising Provider    Answer:  Merlyn AlbertLUKING, WILLIAM S [[id number]]   Restart Lexapro 20 mg half tab by mouth daily then increase to one by mouth daily after 1 week. Restart trazodone as directed for sleep. Given prescription for Klonopin to use for panic attacks, our goal is to decrease use over time once other meds take affect. Has had a tubal ligation for birth control, regular cycles last one about 3 weeks ago. Advised patient not to take Klonopin if she becomes pregnant. Return in about 4 weeks (around 09/01/2015) for recheck. Call back sooner if any problems.

## 2015-09-01 ENCOUNTER — Encounter: Payer: Self-pay | Admitting: Nurse Practitioner

## 2015-09-01 ENCOUNTER — Ambulatory Visit (INDEPENDENT_AMBULATORY_CARE_PROVIDER_SITE_OTHER): Payer: Managed Care, Other (non HMO) | Admitting: Nurse Practitioner

## 2015-09-01 ENCOUNTER — Other Ambulatory Visit: Payer: Self-pay | Admitting: Nurse Practitioner

## 2015-09-01 VITALS — BP 110/80 | Ht 63.0 in | Wt 187.0 lb

## 2015-09-01 DIAGNOSIS — G4700 Insomnia, unspecified: Secondary | ICD-10-CM | POA: Diagnosis not present

## 2015-09-01 DIAGNOSIS — F419 Anxiety disorder, unspecified: Secondary | ICD-10-CM

## 2015-09-03 ENCOUNTER — Encounter: Payer: Self-pay | Admitting: Nurse Practitioner

## 2015-09-03 NOTE — Progress Notes (Signed)
Subjective:  Presents for recheck on anxiety. Decreased number of panic attacks but still having them about 4 times per week. Relieved with Klonopin. Still having issues with sleep. Taking Trazodone 50 mg.   Objective:   BP 110/80   Ht 5\' 3"  (1.6 m)   Wt 187 lb (84.8 kg)   BMI 33.13 kg/m  NAD. Alert, oriented. Thoughts logical coherent and relevant. Dressed appropriately. Lungs clear. Heart regular rate rhythm.  Assessment:  Problem List Items Addressed This Visit      Other   Anxiety - Primary   Insomnia    Other Visit Diagnoses   None.    Plan: increase Trazodone to 1 1/2 tabs qhs (75 mg). Also given information for counseling to help with anxiety. Call back in 3-4 weeks if no improvement in insomnia, will increase Trazodone to 100 mg.  Return in about 4 months (around 01/01/2016) for recheck.

## 2015-09-14 ENCOUNTER — Telehealth: Payer: Self-pay | Admitting: Family Medicine

## 2015-09-14 NOTE — Telephone Encounter (Signed)
Pt called stating that she tried taking 1 1/2 of the traZODone (DESYREL) 50 MG tablet. Pt states that they did not work but that taking 2 did. Pt called to let Eber JonesCarolyn know.

## 2015-09-16 ENCOUNTER — Other Ambulatory Visit: Payer: Self-pay | Admitting: Nurse Practitioner

## 2015-09-16 MED ORDER — TRAZODONE HCL 100 MG PO TABS: 100.0000 mg | ORAL_TABLET | Freq: Every day | ORAL | 2 refills | Status: DC

## 2015-09-19 ENCOUNTER — Other Ambulatory Visit: Payer: Self-pay | Admitting: Nurse Practitioner

## 2015-09-22 ENCOUNTER — Other Ambulatory Visit: Payer: Self-pay | Admitting: Nurse Practitioner

## 2015-09-27 ENCOUNTER — Ambulatory Visit (INDEPENDENT_AMBULATORY_CARE_PROVIDER_SITE_OTHER): Payer: Managed Care, Other (non HMO) | Admitting: Family Medicine

## 2015-09-27 ENCOUNTER — Encounter: Payer: Self-pay | Admitting: Family Medicine

## 2015-09-27 VITALS — BP 120/80 | Temp 98.2°F | Ht 63.0 in | Wt 187.1 lb

## 2015-09-27 DIAGNOSIS — R300 Dysuria: Secondary | ICD-10-CM

## 2015-09-27 DIAGNOSIS — N10 Acute tubulo-interstitial nephritis: Secondary | ICD-10-CM | POA: Diagnosis not present

## 2015-09-27 LAB — POCT URINALYSIS DIPSTICK
Spec Grav, UA: 1.025
pH, UA: 5

## 2015-09-27 MED ORDER — PROMETHAZINE HCL 25 MG PO TABS: 25.0000 mg | ORAL_TABLET | Freq: Four times a day (QID) | ORAL | 0 refills | Status: DC | PRN

## 2015-09-27 MED ORDER — CLONAZEPAM 0.5 MG PO TABS: ORAL_TABLET | ORAL | 1 refills | Status: DC

## 2015-09-27 MED ORDER — CIPROFLOXACIN HCL 500 MG PO TABS: 500.0000 mg | ORAL_TABLET | Freq: Two times a day (BID) | ORAL | 0 refills | Status: DC

## 2015-09-27 NOTE — Progress Notes (Signed)
   Subjective:    Patient ID: Kimberly Everett, female    DOB: January 26, 1991, 25 y.o.   MRN: [id number]  Dysuria   This is a new problem. The current episode started in the past 7 days. The problem occurs every urination. The problem has been unchanged. The quality of the pain is described as burning. The pain is moderate. There has been no fever. Associated symptoms include vomiting. Associated symptoms comments: Abdominal pain . She has tried acetaminophen (AZO) for the symptoms. The treatment provided no relief.   Patient has no other concerns at this time.  Right flank discomfort  Hurts right flank  Felt fever and chilled at timres    Review of Systems  Gastrointestinal: Positive for vomiting.  Genitourinary: Positive for dysuria.       Objective:   Physical Exam Alert vitals stable lungs clear. Heart rare rhythm positive right CVA tenderness   Urinalysis numerous white blood cells no red blood cells    Assessment & Plan:  Impression pyelonephritis by clinical description and exam plan Cipro twice a day 10 days. Culture urine. Patient also requested a refill on clonazepam. Caution to use sparingly and refilled [facility]

## 2015-09-29 LAB — URINE CULTURE

## 2015-10-04 ENCOUNTER — Telehealth: Payer: Self-pay | Admitting: Nurse Practitioner

## 2015-10-04 NOTE — Telephone Encounter (Signed)
Tried to call no answer

## 2015-10-04 NOTE — Telephone Encounter (Signed)
Happy to that, however in non hospitalized situation always s takes few weeks, with ongoing sympotoms rec f u vist for re assessment if still having fever and other acute symptoms

## 2015-10-04 NOTE — Telephone Encounter (Signed)
Pt is wanting to know the results to her urine culture.

## 2015-10-04 NOTE — Telephone Encounter (Signed)
It came back showing multiple different bacteria species in relatively low count when this occurs the cs is not helpful because thee is not one predominant bacteria, finish abx and f u in off if symtoms persist or recur after that

## 2015-10-04 NOTE — Telephone Encounter (Signed)
Discussed with pt. Pt states she is on the 6th or 7th day and she feels the same. Ain in back and side, nausea, fever, dysuria. Pt wants to know if she can have a referral to kidney specialist because she has lots of trouble with kidney stones and infections.

## 2015-10-05 NOTE — Telephone Encounter (Signed)
Discussed with pt. Offered pt appt today. 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 Wanted appt tomorrow. Transferred to front to schedule appt tomorrow. Pt notified to follow up sooner here or ED if worse.

## 2015-10-06 ENCOUNTER — Encounter: Payer: Self-pay | Admitting: Nurse Practitioner

## 2015-10-06 ENCOUNTER — Ambulatory Visit (INDEPENDENT_AMBULATORY_CARE_PROVIDER_SITE_OTHER): Payer: Managed Care, Other (non HMO) | Admitting: Nurse Practitioner

## 2015-10-06 VITALS — BP 116/76 | Temp 98.3°F | Ht 63.0 in | Wt 192.0 lb

## 2015-10-06 DIAGNOSIS — N200 Calculus of kidney: Secondary | ICD-10-CM

## 2015-10-06 DIAGNOSIS — Z23 Encounter for immunization: Secondary | ICD-10-CM

## 2015-10-06 DIAGNOSIS — R1011 Right upper quadrant pain: Secondary | ICD-10-CM | POA: Diagnosis not present

## 2015-10-06 DIAGNOSIS — G8929 Other chronic pain: Secondary | ICD-10-CM | POA: Insufficient documentation

## 2015-10-06 DIAGNOSIS — R10A1 Flank pain, right side: Secondary | ICD-10-CM

## 2015-10-06 DIAGNOSIS — R309 Painful micturition, unspecified: Secondary | ICD-10-CM | POA: Diagnosis not present

## 2015-10-06 DIAGNOSIS — R109 Unspecified abdominal pain: Principal | ICD-10-CM

## 2015-10-06 LAB — POCT URINALYSIS DIPSTICK
PH UA: 6
RBC UA: NEGATIVE
Spec Grav, UA: 1.02

## 2015-10-06 NOTE — Progress Notes (Signed)
Subjective:  Presents for complaints of chronic right flank pain over the past 5 years since her last child was born. Occurs intermittently. Has a history of kidney stones, last flareup was about 2 years ago. Has passed some stones in the meantime but has not been seen for this. Occasional mild burning with urination. Same sexual partner for several years. Occasional blood in her stool. No fevers. Requesting referral to urology for evaluation.  Objective:   BP 116/76   Temp 98.3 F (36.8 C) (Oral)   Ht 5\' 3"  (1.6 m)   Wt 192 lb (87.1 kg)   BMI 34.01 kg/m  NAD. Alert, oriented. Lungs clear. Heart regular rhythm. Distinct tenderness noted in the right mid back area towards the right flank area.  Results for orders placed or performed in visit on 10/06/15  POCT urinalysis dipstick  Result Value Ref Range   Color, UA Yellow    Clarity, UA Clear    Glucose, UA     Bilirubin, UA     Ketones, UA     Spec Grav, UA 1.020    Blood, UA Negative    pH, UA 6.0    Protein, UA     Urobilinogen, UA     Nitrite, UA     Leukocytes, UA Trace (A) Negative     Assessment:  Problem List Items Addressed This Visit      Genitourinary   Kidney stones     Other   Chronic right flank pain - Primary    Other Visit Diagnoses    Painful urination       Relevant Orders   POCT urinalysis dipstick (Completed)   Need for immunization against influenza       Relevant Orders   Flu Vaccine QUAD 36+ mos IM (Fluarix & Fluzone Quad PF (Completed)      Plan: Refer to urology for further evaluation. Patient to call back here in the meantime if symptoms worsen.

## 2015-10-13 ENCOUNTER — Telehealth: Payer: Self-pay | Admitting: Family Medicine

## 2015-10-13 MED ORDER — NAPROXEN SODIUM 550 MG PO TABS: ORAL_TABLET | ORAL | 0 refills | Status: DC

## 2015-10-13 NOTE — Telephone Encounter (Signed)
Prescription sent electronically to pharmacy. Patient was notified and advised that if hematuria persists she needs to go to the ER this weekend-her urine sample was free of blood at her visit and this is a change in her condition. Patient verbalized understanding.

## 2015-10-13 NOTE — Telephone Encounter (Signed)
Anaprox ds bid prn pain, 28, if hematuria persist needs to go to e r this weekend, let her know that urine when she came in was free of blood

## 2015-10-13 NOTE — Telephone Encounter (Signed)
Per Carolyn's note :Patient seen 10/06/15 for chronic flank pain for years with hx of stones in the past but not current and her urine was normal and negative for blood at that visit. Patient was requesting referral to urology at appt for further work up

## 2015-10-13 NOTE — Telephone Encounter (Signed)
Patient was referred to [facility] because of visit that she had with Eber Jonesarolyn on 10/06/15.  She was given an appointment in about a month and a half from now.  She is in a lot of pain and peeing blood.  She is hoping that there is something we can give her or if we can get her in somewhere else sooner to address her issue?

## 2015-10-23 ENCOUNTER — Encounter: Payer: Self-pay | Admitting: Family Medicine

## 2015-11-06 ENCOUNTER — Other Ambulatory Visit: Payer: Self-pay | Admitting: Nurse Practitioner

## 2015-11-24 ENCOUNTER — Other Ambulatory Visit: Payer: Self-pay | Admitting: Nurse Practitioner

## 2015-12-15 ENCOUNTER — Ambulatory Visit (INDEPENDENT_AMBULATORY_CARE_PROVIDER_SITE_OTHER): Payer: Managed Care, Other (non HMO) | Admitting: Family Medicine

## 2015-12-15 ENCOUNTER — Ambulatory Visit (HOSPITAL_COMMUNITY)
Admission: RE | Admit: 2015-12-15 | Discharge: 2015-12-15 | Disposition: A | Discharge status: Home / Self Care | Payer: Managed Care, Other (non HMO) | Source: Ambulatory Visit | Attending: Family Medicine | Admitting: Family Medicine

## 2015-12-15 ENCOUNTER — Encounter: Payer: Self-pay | Admitting: Family Medicine

## 2015-12-15 VITALS — BP 118/72 | Ht 63.0 in | Wt 187.2 lb

## 2015-12-15 DIAGNOSIS — M25561 Pain in right knee: Secondary | ICD-10-CM | POA: Insufficient documentation

## 2015-12-15 MED ORDER — NAPROXEN 500 MG PO TABS: 500.0000 mg | ORAL_TABLET | Freq: Two times a day (BID) | ORAL | 0 refills | Status: DC

## 2015-12-15 NOTE — Progress Notes (Signed)
   Subjective:    Patient ID: Marlis EdelsonElizabeth M Maffei, female    DOB: 03-22-1990, 25 y.o.   MRN: [id number]  HPI  Patient arrives with c/o right knee pain for about a week or 2.  Patient states she hurt her knee playing with her kids  Hurt it several weeks ago, not sure what she was doing  Pain now rad doewn to ankle  Hurts at night aching and drivoing  No sig swelling in the knee, some in the ankle onthat side  Review of Systems No headache, no major weight loss or weight gain, no chest pain no back pain abdominal pain no change in bowel habits complete ROS otherwise negative     Objective:   Physical Exam  Alert vitals stable, NAD. Blood pressure good on repeat. HEENT normal. Lungs clear. Heart regular rate and rhythm. Right knee no noticeable effusion good range of motion no joint laxity no joint line tenderness some diffuse pain to pressure over the patella and behind the knee      Assessment & Plan:  Impression knee strain plan anti-inflammatory medicine prescribed. Symptom care discussed. Local measures discussed x-ray

## 2016-01-01 ENCOUNTER — Ambulatory Visit (INDEPENDENT_AMBULATORY_CARE_PROVIDER_SITE_OTHER): Payer: Managed Care, Other (non HMO) | Admitting: Nurse Practitioner

## 2016-01-01 ENCOUNTER — Encounter: Payer: Self-pay | Admitting: Nurse Practitioner

## 2016-01-01 VITALS — BP 122/82 | Ht 63.0 in | Wt 189.9 lb

## 2016-01-01 DIAGNOSIS — F419 Anxiety disorder, unspecified: Secondary | ICD-10-CM | POA: Diagnosis not present

## 2016-01-01 DIAGNOSIS — G4700 Insomnia, unspecified: Secondary | ICD-10-CM

## 2016-01-01 NOTE — Progress Notes (Signed)
Subjective:  Presents for recheck on her anxiety. Lexapro is no longer working. Her family has noticed increase in her anxiety. Describes herself as anxious, nervous. No change in stress level. Has obsessive thoughts. Trazodone was working well for sleep but not lately. Has gone for days with no sleep. Denies suicidal or homicidal thoughts or ideation. Takes very rare Klonopin.   Objective:   BP 122/82   Ht 5\' 3"  (1.6 m)   Wt 189 lb 14.4 oz (86.1 kg)   LMP 12/15/2015   BMI 33.64 kg/m  NAD. Alert, oriented. Mildly anxious affect. Thoughts logical coherent and relevant. Making good eye contact. Dressed appropriately. Lungs clear. Heart regular rate rhythm.  Assessment:  Problem List Items Addressed This Visit      Other   Anxiety   Insomnia - Primary     Plan: Question whether this is anxiety or possibly bipolar disorder with mania. Recommend referral to mental health provider for evaluation and treatment. Patient agrees with plan. Continue current medications as directed for now. Call back if any problems. Seek help immediately if any suicidal or homicidal thoughts. Given information on mental health providers by our referral coordinator.

## 2016-01-09 ENCOUNTER — Encounter: Payer: Self-pay | Admitting: Family Medicine

## 2016-01-09 ENCOUNTER — Ambulatory Visit (INDEPENDENT_AMBULATORY_CARE_PROVIDER_SITE_OTHER): Payer: Managed Care, Other (non HMO) | Admitting: Family Medicine

## 2016-01-09 VITALS — BP 100/72 | Temp 98.4°F | Ht 63.0 in | Wt 183.5 lb

## 2016-01-09 DIAGNOSIS — B9689 Other specified bacterial agents as the cause of diseases classified elsewhere: Secondary | ICD-10-CM

## 2016-01-09 DIAGNOSIS — J019 Acute sinusitis, unspecified: Secondary | ICD-10-CM | POA: Diagnosis not present

## 2016-01-09 DIAGNOSIS — B338 Other specified viral diseases: Secondary | ICD-10-CM | POA: Diagnosis not present

## 2016-01-09 DIAGNOSIS — B348 Other viral infections of unspecified site: Secondary | ICD-10-CM

## 2016-01-09 MED ORDER — AMOXICILLIN 500 MG PO TABS: 500.0000 mg | ORAL_TABLET | Freq: Three times a day (TID) | ORAL | 0 refills | Status: DC

## 2016-01-09 MED ORDER — HYDROCODONE-HOMATROPINE 5-1.5 MG/5ML PO SYRP: 5.0000 mL | ORAL_SOLUTION | Freq: Four times a day (QID) | ORAL | 0 refills | Status: DC | PRN

## 2016-01-09 NOTE — Progress Notes (Signed)
   Subjective:    Patient ID: Kimberly Everett, female    DOB: 1990/02/03, 25 y.o.   MRN: [id number]  Sinusitis  This is a new problem. The current episode started in the past 7 days. The problem is unchanged. The pain is moderate. Associated symptoms include congestion, coughing, ear pain, headaches and a sore throat. (Fever, wheezing) Past treatments include oral decongestants. The treatment provided no relief.      Review of Systems  HENT: Positive for congestion, ear pain and sore throat.   Respiratory: Positive for cough.   Neurological: Positive for headaches.       Objective:   Physical Exam  Mild sinus tenderness eardrums normal throat normal neck supple lungs clear      Assessment & Plan:  Viral syndrome Secondary rhinosinusitis Patient was seen today for upper respiratory illness. It is felt that the patient is dealing with sinusitis. Antibiotics were prescribed today. Importance of compliance with medication was discussed. Symptoms should gradually resolve over the course of the next several days. If high fevers, progressive illness, difficulty breathing, worsening condition or failure for symptoms to improve over the next several days then the patient is to follow-up. If any emergent conditions the patient is to follow-up in the emergency department otherwise to follow-up in the office.

## 2016-04-09 ENCOUNTER — Ambulatory Visit (INDEPENDENT_AMBULATORY_CARE_PROVIDER_SITE_OTHER): Payer: Managed Care, Other (non HMO) | Admitting: Family Medicine

## 2016-04-09 ENCOUNTER — Other Ambulatory Visit: Payer: Self-pay | Admitting: Nurse Practitioner

## 2016-04-09 ENCOUNTER — Encounter: Payer: Self-pay | Admitting: Family Medicine

## 2016-04-09 VITALS — BP 116/74 | Temp 99.1°F | Ht 63.0 in | Wt 191.0 lb

## 2016-04-09 DIAGNOSIS — K219 Gastro-esophageal reflux disease without esophagitis: Secondary | ICD-10-CM

## 2016-04-09 DIAGNOSIS — J4541 Moderate persistent asthma with (acute) exacerbation: Secondary | ICD-10-CM

## 2016-04-09 MED ORDER — FLUTICASONE PROPIONATE HFA 44 MCG/ACT IN AERO: 2.0000 | INHALATION_SPRAY | Freq: Two times a day (BID) | RESPIRATORY_TRACT | 3 refills | Status: DC

## 2016-04-09 MED ORDER — PREDNISONE 10 MG PO TABS: ORAL_TABLET | ORAL | 0 refills | Status: DC

## 2016-04-09 MED ORDER — AZITHROMYCIN 250 MG PO TABS: ORAL_TABLET | ORAL | 0 refills | Status: DC

## 2016-04-09 NOTE — Progress Notes (Signed)
   Subjective:    Patient ID: Marlis EdelsonElizabeth M Dykes, female    DOB: Feb 28, 1990, 26 y.o.   MRN: [id number]  HPI    Review of Systems     Objective:   Physical Exam        Assessment & Plan:

## 2016-04-09 NOTE — Progress Notes (Signed)
   Subjective:    Patient ID: Kimberly Everett, female    DOB: 06/02/1990, 26 y.o.   MRN: [id number]  Cough  This is a new problem. Episode onset: 3 days. Associated symptoms include wheezing. Treatments tried: inhaler. The treatment provided no relief.   Needs refill on prontonix. Helps with the reflux component.  Started with chest  Aching having discomfort with any breath. Chest pain is sharp. Worse with deep breath or coughing. Anterior. Comes and goes. Sometimes taking  Tighter with wheezing  Using pro air every four to six hours  Non prod cough   No notiecable fever   Long discussion held regarding wheezing. Patient has been wheezing more more in recent years. Wheezes with exercise. Wheezes with exertion. Wheezes with chest colds.  Allergies overall stable Review of Systems  Respiratory: Positive for cough and wheezing.        Objective:   Physical Exam Alert and oriented, vitals reviewed and stable, NAD ENT-TM's and ext canals WNL bilat via otoscopic exam Soft palate, tonsils and post pharynx WNL via oropharyngeal exam Neck-symmetric, no masses; thyroid nonpalpable and nontender Pulmonary-no tachypnea or accessory muscle use; Mild wheezes via auscultation Card--no abnrml murmurs, rhythm reg and rate WNL Carotid pulses symmetric, without bruits        Assessment & Plan:  Impression acute bronchitis with exacerbation of asthma #2 asthma mild persistent discussed. Patient uses inhaler 2-3 times per week even 1 completely stable. Long discussion regarding inflammation. Need to treat this. Need to calm down inflammatory response. Rationale discussed. Plan treat acute symptoms with prednisone taper. This will help the chest wall pain and reactive airways. Antibiotics. Inhaler use discussed.  Also initiate Flovent. Rationale discussed with patient. Hopefully her insurance company will allow us to maintain up to date therapies. Greater than 50% of this 25 minute face to  face visit was spent in counseling and discussion and coordination of care regarding the above diagnosis/diagnosies [facility] .

## 2016-04-14 DIAGNOSIS — J4540 Moderate persistent asthma, uncomplicated: Secondary | ICD-10-CM | POA: Insufficient documentation

## 2016-10-15 ENCOUNTER — Ambulatory Visit (INDEPENDENT_AMBULATORY_CARE_PROVIDER_SITE_OTHER): Payer: Managed Care, Other (non HMO) | Admitting: Family Medicine

## 2016-10-15 ENCOUNTER — Encounter: Payer: Self-pay | Admitting: Family Medicine

## 2016-10-15 VITALS — BP 118/74 | Temp 98.4°F | Ht 63.0 in | Wt 201.0 lb

## 2016-10-15 DIAGNOSIS — J019 Acute sinusitis, unspecified: Secondary | ICD-10-CM

## 2016-10-15 DIAGNOSIS — Z23 Encounter for immunization: Secondary | ICD-10-CM | POA: Diagnosis not present

## 2016-10-15 MED ORDER — BENZONATATE 100 MG PO CAPS: 100.0000 mg | ORAL_CAPSULE | Freq: Three times a day (TID) | ORAL | 2 refills | Status: DC | PRN

## 2016-10-15 MED ORDER — AZITHROMYCIN 250 MG PO TABS: ORAL_TABLET | ORAL | 0 refills | Status: DC

## 2016-10-15 NOTE — Progress Notes (Signed)
   Subjective:    Patient ID: Kimberly Everett, female    DOB: 1990/04/15, 26 y.o.   MRN: [id number]  Sinusitis  This is a new problem. Episode onset: 3 days. Associated symptoms include congestion, coughing, ear pain and a sore throat. Pertinent negatives include no shortness of breath. (Fever) Past treatments include acetaminophen (cold medicine).   Patient with significant head congestion drainage coughing sinus pressure symptoms over the past 3 days nothing is seen to help it out no high fevers no wheezing or difficulty breathing progressive symptoms have been noted   Review of Systems  Constitutional: Negative for activity change and fever.  HENT: Positive for congestion, ear pain, rhinorrhea and sore throat.   Eyes: Negative for discharge.  Respiratory: Positive for cough. Negative for shortness of breath and wheezing.   Cardiovascular: Negative for chest pain.       Objective:   Physical Exam  Constitutional: She appears well-developed.  HENT:  Head: Normocephalic.  Right Ear: External ear normal.  Left Ear: External ear normal.  Nose: Nose normal.  Mouth/Throat: Oropharynx is clear and moist. No oropharyngeal exudate.  Eyes: Right eye exhibits no discharge. Left eye exhibits no discharge.  Neck: Neck supple. No tracheal deviation present.  Cardiovascular: Normal rate and normal heart sounds.   No murmur heard. Pulmonary/Chest: Effort normal and breath sounds normal. She has no wheezes. She has no rales.  Lymphadenopathy:    She has no cervical adenopathy.  Skin: Skin is warm and dry.  Nursing note and vitals reviewed.         Assessment & Plan:  Patient was seen today for upper respiratory illness. It is felt that the patient is dealing with sinusitis. Antibiotics were prescribed today. Importance of compliance with medication was discussed. Symptoms should gradually resolve over the course of the next several days. If high fevers, progressive illness, difficulty  breathing, worsening condition or failure for symptoms to improve over the next several days then the patient is to follow-up. If any emergent conditions the patient is to follow-up in the emergency department otherwise to follow-up in the office.

## 2017-01-03 ENCOUNTER — Ambulatory Visit: Payer: Managed Care, Other (non HMO) | Admitting: Nurse Practitioner

## 2017-01-03 VITALS — BP 120/80 | Temp 98.4°F | Ht 63.0 in | Wt 200.8 lb

## 2017-01-03 DIAGNOSIS — H9201 Otalgia, right ear: Secondary | ICD-10-CM

## 2017-01-03 DIAGNOSIS — M2669 Other specified disorders of temporomandibular joint: Secondary | ICD-10-CM

## 2017-01-03 DIAGNOSIS — R51 Headache: Secondary | ICD-10-CM

## 2017-01-03 DIAGNOSIS — H93A1 Pulsatile tinnitus, right ear: Secondary | ICD-10-CM

## 2017-01-03 MED ORDER — CHLORZOXAZONE 500 MG PO TABS: 500.0000 mg | ORAL_TABLET | Freq: Three times a day (TID) | ORAL | 0 refills | Status: DC | PRN

## 2017-01-03 MED ORDER — NAPROXEN 500 MG PO TABS: 500.0000 mg | ORAL_TABLET | Freq: Two times a day (BID) | ORAL | 0 refills | Status: DC

## 2017-01-03 NOTE — Patient Instructions (Signed)
Temporomandibular Joint Syndrome Temporomandibular joint (TMJ) syndrome is a condition that affects the joints between your jaw and your skull. The TMJs are located near your ears and allow your jaw to open and close. These joints and the nearby muscles are involved in all movements of the jaw. People with TMJ syndrome have pain in the area of these joints and muscles. Chewing, biting, or other movements of the jaw can be difficult or painful. TMJ syndrome can be caused by various things. In many cases, the condition is mild and goes away within a few weeks. For some people, the condition can become a long-term problem. What are the causes? Possible causes of TMJ syndrome include:  Grinding your teeth or clenching your jaw. Some people do this when they are under stress.  Arthritis.  Injury to the jaw.  Head or neck injury.  Teeth or dentures that are not aligned well.  In some cases, the cause of TMJ syndrome may not be known. What are the signs or symptoms? The most common symptom is an aching pain on the side of the head in the area of the TMJ. Other symptoms may include:  Pain when moving your jaw, such as when chewing or biting.  Being unable to open your jaw all the way.  Making a clicking sound when you open your mouth.  Headache.  Earache.  Neck or shoulder pain.  How is this diagnosed? Diagnosis can usually be made based on your symptoms, your medical history, and a physical exam. Your health care provider may check the range of motion of your jaw. Imaging tests, such as X-rays or an MRI, are sometimes done. You may need to see your dentist to determine if your teeth and jaw are lined up correctly. How is this treated? TMJ syndrome often goes away on its own. If treatment is needed, the options may include:  Eating soft foods and applying ice or heat.  Medicines to relieve pain or inflammation.  Medicines to relax the muscles.  A splint, bite plate, or mouthpiece  to prevent teeth grinding or jaw clenching.  Relaxation techniques or counseling to help reduce stress.  Transcutaneous electrical nerve stimulation (TENS). This helps to relieve pain by applying an electrical current through the skin.  Acupuncture. This is sometimes helpful to relieve pain.  Jaw surgery. This is rarely needed.  Follow these instructions at home:  Take medicines only as directed by your health care provider.  Eat a soft diet if you are having trouble chewing.  Apply ice to the painful area. ? Put ice in a plastic bag. ? Place a towel between your skin and the bag. ? Leave the ice on for 20 minutes, 2-3 times a day.  Apply a warm compress to the painful area as directed.  Massage your jaw area and perform any jaw stretching exercises as recommended by your health care provider.  If you were given a mouthpiece or bite plate, wear it as directed.  Avoid foods that require a lot of chewing. Do not chew gum.  Keep all follow-up visits as directed by your health care provider. This is important. Contact a health care provider if:  You are having trouble eating.  You have new or worsening symptoms. Get help right away if:  Your jaw locks open or closed. This information is not intended to replace advice given to you by your health care provider. Make sure you discuss any questions you have with your health care provider. Document   Released: 10/09/2000 Document Revised: 09/14/2015 Document Reviewed: 08/19/2013 Elsevier Interactive Patient Education  2018 [employer]  

## 2017-01-05 ENCOUNTER — Encounter: Payer: Self-pay | Admitting: Nurse Practitioner

## 2017-01-05 NOTE — Progress Notes (Signed)
Subjective:  Presents for c/o ringing and pain in the right ear for about a week.  No fever.  Has felt pressure, no drainage.  Has also had problems in the left ear off and on.  No sinus pressure sore throat or cough.  No history of teeth grinding.  No popping or difficulty opening or closing the mouth.  Slight headache at times along the right side of the face near the ear.  Objective:   BP 120/80   Temp 98.4 F (36.9 C) (Oral)   Ht 5\' 3"  (1.6 m)   Wt 200 lb 12.8 oz (91.1 kg)   BMI 35.57 kg/m  NAD.  Alert, oriented.  TMs mild clear effusion, no erythema.  Pharynx clear.  Neck supple with minimal adenopathy.  Lungs clear.  Heart regular rate and rhythm.  Distinct tenderness noted along the TMJ bilaterally.  No difficulty opening and closing the mouth.  No crepitus or popping noted.  Very tight tender muscles noted along the lateral neck area and trapezius bilaterally.    Assessment:  TMJ inflammation    Plan:   Meds ordered this encounter  Medications  . naproxen (NAPROSYN) 500 MG tablet    Sig: Take 1 tablet (500 mg total) by mouth 2 (two) times daily with a meal.    Dispense:  30 tablet    Refill:  0    Order Specific Question:   Supervising Provider    Answer:   Merlyn AlbertLUKING, WILLIAM S [[id number]]  . chlorzoxazone (PARAFON) 500 MG tablet    Sig: Take 1 tablet (500 mg total) by mouth 3 (three) times daily as needed for muscle spasms.    Dispense:  30 tablet    Refill:  0    Order Specific Question:   Supervising Provider    Answer:   Riccardo DubinLUKING, WILLIAM S [[id number]]   Ice/heat applications and massage to affected area.  Discussed importance of stress reduction.  Recommend patient follow-up with her dentist to evaluate for bruxism and teeth alignment.  Call back if symptoms worsen or persist.

## 2017-02-25 ENCOUNTER — Ambulatory Visit (INDEPENDENT_AMBULATORY_CARE_PROVIDER_SITE_OTHER): Payer: Managed Care, Other (non HMO) | Admitting: Family Medicine

## 2017-02-25 ENCOUNTER — Encounter: Payer: Self-pay | Admitting: Family Medicine

## 2017-02-25 VITALS — BP 132/80 | Temp 98.5°F | Ht 63.0 in | Wt 205.0 lb

## 2017-02-25 DIAGNOSIS — J019 Acute sinusitis, unspecified: Secondary | ICD-10-CM

## 2017-02-25 DIAGNOSIS — B9689 Other specified bacterial agents as the cause of diseases classified elsewhere: Secondary | ICD-10-CM

## 2017-02-25 MED ORDER — AMOXICILLIN-POT CLAVULANATE 875-125 MG PO TABS: ORAL_TABLET | ORAL | 0 refills | Status: DC

## 2017-02-25 NOTE — Progress Notes (Signed)
   Subjective:    Patient ID: Kimberly EdelsonElizabeth M Withrow, female    DOB: Mar 22, 1990, 27 y.o.   MRN: [id number]  Sinusitis  This is a new problem. Episode onset: one week. Associated symptoms include coughing, ear pain and a sore throat. (Fever) Treatments tried: sinus meds.   Week ago had an exposure to a cold and sickness, got a runnny and stuffy nose and cough  Then throa started huting bad, both ears huritng,   Cough occas prod  Nasal discharge gunkiness  feling pain in ears with swallowing    Review of Systems  HENT: Positive for ear pain and sore throat.   Respiratory: Positive for cough.        Objective:   Physical Exam  Alert, mild malaise. Hydration good Vitals stable. frontal/ maxillary tenderness evident positive nasal congestion. pharynx normal neck supple  lungs clear/no crackles or wheezes. heart regular in rhythm       Assessment & Plan:  Impression rhinosinusitis likely post viral, discussed with patient. plan antibiotics prescribed. Questions answered. Symptomatic care discussed. warning signs discussed. WSL

## 2017-04-04 ENCOUNTER — Other Ambulatory Visit: Payer: Self-pay | Admitting: Nurse Practitioner

## 2017-04-29 ENCOUNTER — Ambulatory Visit (INDEPENDENT_AMBULATORY_CARE_PROVIDER_SITE_OTHER): Payer: Managed Care, Other (non HMO) | Admitting: Family Medicine

## 2017-04-29 ENCOUNTER — Encounter: Payer: Self-pay | Admitting: Family Medicine

## 2017-04-29 VITALS — BP 112/88 | Ht 63.0 in | Wt 210.0 lb

## 2017-04-29 DIAGNOSIS — Z131 Encounter for screening for diabetes mellitus: Secondary | ICD-10-CM | POA: Diagnosis not present

## 2017-04-29 DIAGNOSIS — G5602 Carpal tunnel syndrome, left upper limb: Secondary | ICD-10-CM

## 2017-04-29 MED ORDER — DICLOFENAC SODIUM 75 MG PO TBEC: 75.0000 mg | DELAYED_RELEASE_TABLET | Freq: Two times a day (BID) | ORAL | 0 refills | Status: DC

## 2017-04-29 NOTE — Progress Notes (Signed)
   Subjective:    Patient ID: Kimberly Everett, female    DOB: 1990/02/12, 27 y.o.   MRN: [id number]  HPI Patient is here today with complaints of left wrist and hand pain ongoing off and on for a year with a flare up recently in the past week. Per pt seems to be getting worse. She is taking tylenol and ibuprofen which don't seem to help. 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  PMH benign no injury Review of Systems  Constitutional: Negative for activity change and appetite change.  HENT: Negative for congestion.   Respiratory: Negative for cough.   Cardiovascular: Negative for chest pain.  Gastrointestinal: Negative for abdominal pain and vomiting.  Skin: Negative for color change.  Neurological: Positive for numbness. Negative for weakness.  Psychiatric/Behavioral: Negative for confusion.       Objective:   Physical Exam  Constitutional: She appears well-developed.  HENT:  Head: Normocephalic.  Right Ear: External ear normal.  Left Ear: External ear normal.  Nose: Nose normal.  Mouth/Throat: Oropharynx is clear and moist. No oropharyngeal exudate.  Eyes: Right eye exhibits no discharge. Left eye exhibits no discharge.  Neck: Neck supple. No tracheal deviation present.  Cardiovascular: Normal rate and normal heart sounds.  No murmur heard. Pulmonary/Chest: Effort normal and breath sounds normal. She has no wheezes. She has no rales.  Lymphadenopathy:    She has no cervical adenopathy.  Skin: Skin is warm and dry.  Nursing note and vitals reviewed.  Positive Tinel positive Phalen's no weakness noted in the wrist       Assessment & Plan:  Probable carpal tunnel Anti-inflammatory for the next 1-2  weeks Wear brace on a regular basis for the next week then after that wear at nighttime only Follow-up in 4 weeks If ongoing symptoms next step nerve conduction study and possible referral to hand specialist

## 2017-05-22 LAB — LIPID PANEL
CHOL/HDL RATIO: 3.5 ratio (ref 0.0–4.4)
Cholesterol, Total: 190 mg/dL (ref 100–199)
HDL: 54 mg/dL (ref >39)
LDL CALC: 99 mg/dL (ref 0–99)
Triglycerides: 185 mg/dL — ABNORMAL HIGH (ref 0–149)
VLDL Cholesterol Cal: 37 mg/dL (ref 5–40)

## 2017-05-22 LAB — GLUCOSE, RANDOM: GLUCOSE: 88 mg/dL (ref 65–99)

## 2017-05-22 LAB — TSH: TSH: 2.41 u[IU]/mL (ref 0.450–4.500)

## 2017-05-27 ENCOUNTER — Ambulatory Visit (INDEPENDENT_AMBULATORY_CARE_PROVIDER_SITE_OTHER): Payer: Managed Care, Other (non HMO) | Admitting: Family Medicine

## 2017-05-27 ENCOUNTER — Encounter: Payer: Self-pay | Admitting: Family Medicine

## 2017-05-27 VITALS — BP 138/82 | Ht 63.0 in | Wt 208.4 lb

## 2017-05-27 DIAGNOSIS — F320 Major depressive disorder, single episode, mild: Secondary | ICD-10-CM

## 2017-05-27 DIAGNOSIS — G5602 Carpal tunnel syndrome, left upper limb: Secondary | ICD-10-CM | POA: Diagnosis not present

## 2017-05-27 DIAGNOSIS — J4541 Moderate persistent asthma with (acute) exacerbation: Secondary | ICD-10-CM | POA: Diagnosis not present

## 2017-05-27 MED ORDER — ALBUTEROL SULFATE HFA 108 (90 BASE) MCG/ACT IN AERS: INHALATION_SPRAY | RESPIRATORY_TRACT | 5 refills | Status: DC

## 2017-05-27 NOTE — Progress Notes (Signed)
   Subjective:    Patient ID: Kimberly Everett, female    DOB: [birth year], 27 y.o.   MRN: [id number] Patient arrives with several concerns HPI Pt here for 4 week follow up on Carpal Tunnel syndrome in left wrist. Pt wore brace as Dr. Lorin Picket advised. No problems so far.   Overall improved sig, still gets some at night  Right handed   pt working at home, overall better.  Daytime some symptoms minimal  Patient has long-standing history of asthma.  States compliance with Flovent.  Recent flares 1 out of the pollen.  Needs refill on albuterol.  Also history of depression.  Screening today shows that control is not the best.  No suicidal thoughts.  Went to a psychiatrist for a while was on a number of medicines.  And on Abilify.  States that helped some but she did not like the hot therapies things so she held off.  Currently she wishes to maintain on her own       Review of Systems No headache, no major weight loss or weight gain, no chest pain no back pain abdominal pain no change in bowel habits complete ROS otherwise negative     Objective:   Physical Exam  Alert active no acute distress.  HEENT normal neck supple.  Lungs clear no wheezing.  Heart regular rate and rhythm.  Neuro exam grip intact.  Positive Phalen sign.  Slight diminished sensation in appropriate distribution.  Mental health no suicidal thoughts.  She PHQ 7 for further details      Assessment & Plan:  1 impression carpal tunnel syndrome.  Clinically improved.  Maintain nighttime splints and try to wean off discussed  2.  Asthma worse this spring with pollen.  Discussed that he needs refill on albuterol.  Claims compliance with Flovent  3.  Element of depression.  See above conversation wishes to hold off on referral at this time

## 2017-06-13 ENCOUNTER — Ambulatory Visit (INDEPENDENT_AMBULATORY_CARE_PROVIDER_SITE_OTHER): Payer: Managed Care, Other (non HMO) | Admitting: Nurse Practitioner

## 2017-06-13 ENCOUNTER — Encounter: Payer: Self-pay | Admitting: Nurse Practitioner

## 2017-06-13 VITALS — BP 128/86 | Temp 98.4°F | Ht 63.0 in | Wt 204.2 lb

## 2017-06-13 DIAGNOSIS — R300 Dysuria: Secondary | ICD-10-CM

## 2017-06-13 DIAGNOSIS — R109 Unspecified abdominal pain: Secondary | ICD-10-CM

## 2017-06-13 LAB — POCT URINALYSIS DIPSTICK: pH, UA: 7 (ref 5.0–8.0)

## 2017-06-13 MED ORDER — DICLOFENAC SODIUM 75 MG PO TBEC: 75.0000 mg | DELAYED_RELEASE_TABLET | Freq: Two times a day (BID) | ORAL | 0 refills | Status: DC

## 2017-06-13 MED ORDER — HYDROCODONE-ACETAMINOPHEN 5-325 MG PO TABS: 1.0000 | ORAL_TABLET | ORAL | 0 refills | Status: DC | PRN

## 2017-06-13 MED ORDER — ONDANSETRON 8 MG PO TBDP: 8.0000 mg | ORAL_TABLET | Freq: Three times a day (TID) | ORAL | 0 refills | Status: DC | PRN

## 2017-06-15 ENCOUNTER — Encounter: Payer: Self-pay | Admitting: Nurse Practitioner

## 2017-06-15 NOTE — Progress Notes (Signed)
Subjective:  Present for c/o possible kidney stone. Has not had one in a couple of years. Began 2 days ago. Pain in the low back and right flank area. Extreme pain last night low back area into right flank. Nausea, vomiting x 2. Slight diarrhea. Dysuria. Pressure and urgency. No hematuria. Slightly better today. No fever. Same sexual partner. Has had a BTL.   Objective:   BP 128/86   Temp 98.4 F (36.9 C) (Oral)   Ht  (1.6 m)   Wt 204 lb 3.2 oz (92.6 kg)   BMI 36.17 kg/m  NAD. Alert, oriented. Lungs clear. Heart RRR. Mild right CVA tenderness. Distinct right mid flank tenderness. Abdomen soft nondistended with right mid abdominal tenderness towards the flank area.                                   POCT Urinalysis Dipstick  Result Value Ref Range   Color, UA     Clarity, UA     Glucose, UA     Bilirubin, UA     Ketones, UA     Spec Grav, UA <=1.005 (A) 1.010 - 1.025   Blood, UA ++    pH, UA 7.0 5.0 - 8.0   Protein, UA     Urobilinogen, UA  0.2 or 1.0 E.U./dL   Nitrite, UA     Leukocytes, UA Small (1+) (A) Negative   Appearance     Odor     Urine micro occasional epithelial cell.  Rare WBC.  Assessment:  Dysuria - Plan: POCT Urinalysis Dipstick, Urine Culture  Acute right flank pain    Plan:     Meds ordered this encounter  Medications  . diclofenac (VOLTAREN) 75 MG EC tablet    Sig: Take 1 tablet (75 mg total) by mouth 2 (two) times daily.    Dispense:  30 tablet    Refill:  0    Order Specific Question:   Supervising Provider    Answer:   Merlyn Albert [[id number]]  . ondansetron (ZOFRAN-ODT) 8 MG disintegrating tablet    Sig: Take 1 tablet (8 mg total) by mouth every 8 (eight) hours as needed for nausea or vomiting.    Dispense:  30 tablet    Refill:  0    Order Specific Question:   Supervising Provider    Answer:   Merlyn Albert [[id number]]  . HYDROcodone-acetaminophen (NORCO/VICODIN) 5-325 MG tablet    Sig: Take 1 tablet by mouth every 4 (four) hours  as needed.    Dispense:  18 tablet    Refill:  0    Order Specific Question:   Supervising Provider    Answer:   Merlyn Albert [[id number]]   Warning signs reviewed.  Call back in 72 hours if no improvement in symptoms, go to ED over the weekend if worse.

## 2017-06-16 ENCOUNTER — Other Ambulatory Visit: Payer: Self-pay | Admitting: Nurse Practitioner

## 2017-06-16 LAB — URINE CULTURE

## 2017-06-16 LAB — SPECIMEN STATUS REPORT

## 2017-06-16 MED ORDER — CEPHALEXIN 500 MG PO CAPS: 500.0000 mg | ORAL_CAPSULE | Freq: Three times a day (TID) | ORAL | 0 refills | Status: DC

## 2017-06-25 ENCOUNTER — Telehealth: Payer: Self-pay | Admitting: *Deleted

## 2017-06-25 ENCOUNTER — Other Ambulatory Visit: Payer: Self-pay | Admitting: Nurse Practitioner

## 2017-06-25 MED ORDER — AZITHROMYCIN 250 MG PO TABS: ORAL_TABLET | ORAL | 0 refills | Status: DC

## 2017-06-25 NOTE — Telephone Encounter (Signed)
Contacted patient and pt is aware that med was sent in

## 2017-06-25 NOTE — Telephone Encounter (Signed)
Sent Zpack to [facility]; call back if persists.

## 2017-06-25 NOTE — Telephone Encounter (Signed)
Patient called stating she has brought both of her son's to the doctor and they were positive for strep and now her throat is hurting. Patient wants to know if something can be called in for her since both of her son's has been seen and tested positive for strep. Please advise

## 2017-07-14 ENCOUNTER — Encounter: Payer: Self-pay | Admitting: Family Medicine

## 2017-07-14 ENCOUNTER — Ambulatory Visit (INDEPENDENT_AMBULATORY_CARE_PROVIDER_SITE_OTHER): Payer: Managed Care, Other (non HMO) | Admitting: Family Medicine

## 2017-07-14 VITALS — Temp 98.1°F | Ht 63.0 in | Wt 204.8 lb

## 2017-07-14 DIAGNOSIS — R300 Dysuria: Secondary | ICD-10-CM

## 2017-07-14 DIAGNOSIS — Z87442 Personal history of urinary calculi: Secondary | ICD-10-CM | POA: Diagnosis not present

## 2017-07-14 DIAGNOSIS — N1330 Unspecified hydronephrosis: Secondary | ICD-10-CM

## 2017-07-14 DIAGNOSIS — N23 Unspecified renal colic: Secondary | ICD-10-CM | POA: Diagnosis not present

## 2017-07-14 LAB — POCT URINALYSIS DIPSTICK
SPEC GRAV UA: 1.015 (ref 1.010–1.025)
pH, UA: 6 (ref 5.0–8.0)

## 2017-07-14 MED ORDER — HYDROCODONE-ACETAMINOPHEN 5-325 MG PO TABS: 1.0000 | ORAL_TABLET | Freq: Four times a day (QID) | ORAL | 0 refills | Status: DC | PRN

## 2017-07-14 MED ORDER — TAMSULOSIN HCL 0.4 MG PO CAPS: 0.4000 mg | ORAL_CAPSULE | Freq: Every day | ORAL | 0 refills | Status: DC

## 2017-07-14 MED ORDER — ONDANSETRON 4 MG PO TBDP: 4.0000 mg | ORAL_TABLET | Freq: Three times a day (TID) | ORAL | 0 refills | Status: DC | PRN

## 2017-07-14 NOTE — Progress Notes (Signed)
   Subjective:    Patient ID: Kimberly Everett, female    DOB: 1990-01-31, 27 y.o.   MRN: [id number]  HPI  Patient arrives with dysuria off and on for a few weeks.  Saw carolyn about a month ago  Slowly got to feeling better, then now worse  Right flank pain , thought maybe saw blood I the urine  Pain at times pretty rough, pain bad ast night, could not get comfortable  Pos hx of kidney stone surgery and removal and had two before at home    Results for orders placed or performed in visit on 07/14/17  POCT urinalysis dipstick  Result Value Ref Range   Color, UA     Clarity, UA     Glucose, UA  Negative   Bilirubin, UA     Ketones, UA     Spec Grav, UA 1.015 1.010 - 1.025   Blood, UA     pH, UA 6.0 5.0 - 8.0   Protein, UA  Negative   Urobilinogen, UA  0.2 or 1.0 E.U./dL   Nitrite, UA     Leukocytes, UA  Negative   Appearance     Odor     Patient's  Pain is primarily been in right flank.  Times severe.  At times colicky.  Having a difficult time finding an easy position.  Substantial association with nausea and vomiting.  Diminished appetite understandably prior history of kidney stones.  This pain is very similar patient is a blood in her urine.    Review of Systems No headache, no major weight loss or weight gain, no chest pain no back pain abdominal pain no change in bowel habits complete ROS otherwise negative     Objective:   Physical Exam  Alert and oriented, vitals reviewed and stable, NAD ENT-TM's and ext canals WNL bilat via otoscopic exam Soft palate, tonsils and post pharynx WNL via oropharyngeal exam Neck-symmetric, no masses; thyroid nonpalpable and nontender Pulmonary-no tachypnea or accessory muscle use; Clear without wheezes via auscultation Card--no abnrml murmurs, rhythm reg and rate WNL Carotid pulses symmetric, without bruits Distinct right CVA tenderness urine rare white blood cell rare red blood cell.  No major abdominal tenderness or  guarding some right lateral abdominal tenderness to deep palpation      Assessment & Plan:  1 impression probable kidney stone.  Discussed.  Will initiate Flomax rationale discussed.  Also add hydrocodone as needed side effects discussed.  Ultrasound specific to the renal system.  Rationale discussed.  Further recommendations based on results  Greater than 50% of this 25 minute face to face visit was spent in counseling and discussion and coordination of care regarding the above diagnosis/diagnosies

## 2017-07-16 ENCOUNTER — Ambulatory Visit (HOSPITAL_COMMUNITY)
Admission: RE | Admit: 2017-07-16 | Discharge: 2017-07-16 | Disposition: A | Discharge status: Home / Self Care | Payer: Managed Care, Other (non HMO) | Source: Ambulatory Visit | Attending: Family Medicine | Admitting: Family Medicine

## 2017-07-16 DIAGNOSIS — N23 Unspecified renal colic: Secondary | ICD-10-CM | POA: Diagnosis not present

## 2017-07-16 DIAGNOSIS — N1330 Unspecified hydronephrosis: Secondary | ICD-10-CM | POA: Diagnosis not present

## 2017-07-16 DIAGNOSIS — R300 Dysuria: Secondary | ICD-10-CM | POA: Insufficient documentation

## 2017-07-17 NOTE — Addendum Note (Signed)
Addended by: Metro KungICHARDS, Jiles Goya M on: 07/17/2017 04:57 PM   Modules accepted: Orders

## 2017-07-22 ENCOUNTER — Telehealth: Payer: Self-pay | Admitting: Family Medicine

## 2017-07-22 NOTE — Telephone Encounter (Signed)
Kimberly Everett states pt left message on her voicemail that we need to send her records to [facility] for her appointment 07/25/17  Called pt to clarify - yes, [facility] [place] [facility]  Referral dated 07/17/17 & have not been able to process yet, will print & fax records - pt aware & verbalized understanding

## 2017-09-26 ENCOUNTER — Encounter: Payer: Self-pay | Admitting: Family Medicine

## 2017-09-26 ENCOUNTER — Ambulatory Visit (INDEPENDENT_AMBULATORY_CARE_PROVIDER_SITE_OTHER): Payer: Managed Care, Other (non HMO) | Admitting: Family Medicine

## 2017-09-26 VITALS — BP 122/82 | Temp 98.6°F | Ht 63.0 in | Wt 195.2 lb

## 2017-09-26 DIAGNOSIS — J329 Chronic sinusitis, unspecified: Secondary | ICD-10-CM

## 2017-09-26 MED ORDER — AMOXICILLIN-POT CLAVULANATE 875-125 MG PO TABS: ORAL_TABLET | ORAL | 0 refills | Status: DC

## 2017-09-26 NOTE — Progress Notes (Signed)
   Subjective:    Patient ID: Kimberly Everett, female    DOB: 1990-06-17, 27 y.o.   MRN: [id number]  Cough  This is a new problem. The current episode started in the past 7 days. Associated symptoms include ear pain, a fever, nasal congestion, a sore throat and wheezing. Treatments tried: otc cold meds.   Pt had slight sore throat, felt bad  Then got worse   Coughing chest hurting fever  Stuffy runy nose  Body aching neck sore tnecer and painfu     Glad hurting  h Felt wrnm    Review of Systems  Constitutional: Positive for fever.  HENT: Positive for ear pain and sore throat.   Respiratory: Positive for cough and wheezing.        Objective:   Physical Exam  Alert, mild malaise. Hydration good Vitals stable. frontal/ maxillary tenderness evident positive nasal congestion. pharynx normal neck supple  lungs clear/no crackles or wheezes. heart regular in rhythm       Assessment & Plan:  Impression rhinosinusitis likely post viral, discussed with patient. plan antibiotics prescribed. Questions answered. Symptomatic care discussed. warning signs discussed. WSL Add albuterol as needed for wheezing but warning signs discussed

## 2017-11-04 ENCOUNTER — Ambulatory Visit: Payer: Managed Care, Other (non HMO)

## 2017-11-06 ENCOUNTER — Ambulatory Visit: Payer: Medicaid Other

## 2017-11-13 ENCOUNTER — Ambulatory Visit (INDEPENDENT_AMBULATORY_CARE_PROVIDER_SITE_OTHER): Payer: Managed Care, Other (non HMO) | Admitting: Family Medicine

## 2017-11-13 ENCOUNTER — Encounter: Payer: Self-pay | Admitting: Family Medicine

## 2017-11-13 VITALS — BP 116/70 | Temp 99.4°F | Ht 63.0 in | Wt 195.0 lb

## 2017-11-13 DIAGNOSIS — J029 Acute pharyngitis, unspecified: Secondary | ICD-10-CM

## 2017-11-13 DIAGNOSIS — J019 Acute sinusitis, unspecified: Secondary | ICD-10-CM | POA: Diagnosis not present

## 2017-11-13 LAB — POCT RAPID STREP A (OFFICE): Rapid Strep A Screen: NEGATIVE

## 2017-11-13 MED ORDER — PREDNISONE 20 MG PO TABS: ORAL_TABLET | ORAL | 0 refills | Status: DC

## 2017-11-13 MED ORDER — AZITHROMYCIN 250 MG PO TABS: ORAL_TABLET | ORAL | 0 refills | Status: DC

## 2017-11-13 NOTE — Progress Notes (Signed)
   Subjective:    Patient ID: Kimberly Everett, female    DOB: [birth year], 27 y.o.   MRN: [id number]  Sore Throat   This is a new problem. Episode onset: 5 days. Associated symptoms include congestion and headaches. Pertinent negatives include no coughing, shortness of breath or vomiting. Associated symptoms comments: fever. She has tried NSAIDs (cold meds) for the symptoms.   Results for orders placed or performed in visit on 11/13/17  POCT rapid strep A  Result Value Ref Range   Rapid Strep A Screen Negative Negative   Reports sore throat x 6 days as well as tenderness to the left side of her neck. States throat feels irritated and dry. Reports nasal congestion and sneezing as well. Has not checked temperature at home but has felt like she's running a fever at times. Reports headache "all over" intermittently this week as well. Reports occasaional chest tightness/wheezing when she takes a deep breath. Has been using albuterol inhaler about 2-3 times per day prn for wheeze since she's been sick.   Review of Systems  HENT: Positive for congestion, sneezing and sore throat.   Respiratory: Negative for cough, shortness of breath and wheezing.   Gastrointestinal: Negative for nausea and vomiting.  Neurological: Positive for headaches.       Objective:   Physical Exam  Constitutional: She is oriented to person, place, and time. She appears well-developed and well-nourished. No distress.  General malaise  HENT:  Head: Normocephalic and atraumatic.  Right Ear: Tympanic membrane normal.  Left Ear: Tympanic membrane normal.  Nose: Sinus tenderness present.  Mouth/Throat: Uvula is midline. Posterior oropharyngeal erythema present.  Neck: Neck supple.  Cardiovascular: Normal rate, regular rhythm and normal heart sounds.  Pulmonary/Chest: Effort normal and breath sounds normal. No respiratory distress. She has no wheezes.  Lymphadenopathy:    She has cervical adenopathy.  Neurological:  She is alert and oriented to person, place, and time.  Skin: Skin is warm and dry.  Psychiatric: She has a normal mood and affect.  Nursing note and vitals reviewed.      Assessment & Plan:  1. Acute rhinosinusitis  Rapid strep negative, will send for culture. Given duration of symptoms and sinus tenderness on exam will go ahead and treat with a coarse of abx. Z-pack prescribed. Also prescribed prednisone 20 mg, 2 tablets daily x5 days.  Recommend continued use of albuterol inhaler as needed.  Patient to notify us if her breathing symptoms worsen has significant wheezing or shortness of breath.  She verbalized understanding.  Dr. Lorin Picket was consulted on this visit, he examined the patient, and is in agreement with the above treatment plan.

## 2017-11-14 ENCOUNTER — Ambulatory Visit: Payer: Medicaid Other

## 2017-11-14 LAB — STREP A DNA PROBE: Strep Gp A Direct, DNA Probe: NEGATIVE

## 2017-12-15 ENCOUNTER — Ambulatory Visit: Payer: Managed Care, Other (non HMO) | Admitting: Family Medicine

## 2017-12-15 ENCOUNTER — Encounter: Payer: Self-pay | Admitting: Family Medicine

## 2017-12-15 ENCOUNTER — Ambulatory Visit (INDEPENDENT_AMBULATORY_CARE_PROVIDER_SITE_OTHER): Payer: Managed Care, Other (non HMO) | Admitting: Family Medicine

## 2017-12-15 VITALS — BP 118/82 | Temp 98.6°F | Ht 63.0 in | Wt 198.8 lb

## 2017-12-15 DIAGNOSIS — B9689 Other specified bacterial agents as the cause of diseases classified elsewhere: Secondary | ICD-10-CM

## 2017-12-15 DIAGNOSIS — J019 Acute sinusitis, unspecified: Secondary | ICD-10-CM

## 2017-12-15 MED ORDER — AMOXICILLIN 500 MG PO TABS: 500.0000 mg | ORAL_TABLET | Freq: Three times a day (TID) | ORAL | 0 refills | Status: DC

## 2017-12-15 NOTE — Progress Notes (Signed)
   Subjective:    Patient ID: Kimberly EdelsonElizabeth M Everett, female    DOB: [birth year], 27 y.o.   MRN: [id number]  Fever   This is a new problem. The current episode started in the past 7 days. Associated symptoms include congestion, coughing, headaches and a sore throat. Pertinent negatives include no chest pain, ear pain or wheezing. Associated symptoms comments: Nose bleeds. Treatments tried: otc meds.   Significant head congestion drainage coughing sinus pressure in addition to this some intermittent nosebleeds denies any other further troubles currently   Review of Systems  Constitutional: Positive for fever. Negative for activity change.  HENT: Positive for congestion, rhinorrhea and sore throat. Negative for ear pain.   Eyes: Negative for discharge.  Respiratory: Positive for cough. Negative for shortness of breath and wheezing.   Cardiovascular: Negative for chest pain.  Neurological: Positive for headaches.       Objective:   Physical Exam  Constitutional: She appears well-developed.  HENT:  Head: Normocephalic.  Nose: Nose normal.  Mouth/Throat: Oropharynx is clear and moist. No oropharyngeal exudate.  Neck: Neck supple.  Cardiovascular: Normal rate and normal heart sounds.  No murmur heard. Pulmonary/Chest: Effort normal and breath sounds normal. She has no wheezes.  Lymphadenopathy:    She has no cervical adenopathy.  Skin: Skin is warm and dry.  Nursing note and vitals reviewed.         Assessment & Plan:  Patient was seen today for upper respiratory illness. It is felt that the patient is dealing with sinusitis.  Antibiotics were prescribed today. Importance of compliance with medication was discussed.  Symptoms should gradually resolve over the course of the next several days. If high fevers, progressive illness, difficulty breathing, worsening condition or failure for symptoms to improve over the next several days then the patient is to follow-up.  If any emergent  conditions the patient is to follow-up in the emergency department otherwise to follow-up in the office.  Use saline nasal spray and humidifier to help with nosebleeds if ongoing trouble notify us

## 2018-01-02 ENCOUNTER — Other Ambulatory Visit: Payer: Self-pay | Admitting: *Deleted

## 2018-01-02 MED ORDER — PANTOPRAZOLE SODIUM 40 MG PO TBEC: DELAYED_RELEASE_TABLET | ORAL | 0 refills | Status: DC

## 2018-02-09 ENCOUNTER — Ambulatory Visit (INDEPENDENT_AMBULATORY_CARE_PROVIDER_SITE_OTHER): Payer: Managed Care, Other (non HMO) | Admitting: Family Medicine

## 2018-02-09 ENCOUNTER — Encounter: Payer: Self-pay | Admitting: Family Medicine

## 2018-02-09 VITALS — BP 106/78 | Temp 98.3°F | Ht 63.0 in | Wt 199.0 lb

## 2018-02-09 DIAGNOSIS — J111 Influenza due to unidentified influenza virus with other respiratory manifestations: Secondary | ICD-10-CM

## 2018-02-09 NOTE — Progress Notes (Signed)
   Subjective:    Patient ID: Kimberly Everett, female    DOB: 09-12-1990, 28 y.o.   MRN: [id number]  HPI  Patient is here today with complaints of dry. cough,bilateral ear pain,with the left being worse,sore throat and fever. She has had these symptoms for the last three - four days,and she has been taking otc cold and flu and tylenol  Started with sore throat, cough, fever starting 3-4 days ago; highest fever around 100. Last night has gotten worse with worsening sore throat and left ear pain. Reports intermittent h/a and fatigue. Denies body aches. Denies shortness of breath/ wheezing. 2 days ago used rescue inhaler once for bad cough.    Review of Systems  Constitutional: Positive for fatigue and fever.  HENT: Positive for congestion, ear pain and sore throat. Negative for sinus pressure and sinus pain.   Respiratory: Positive for cough. Negative for shortness of breath and wheezing.   Gastrointestinal: Negative for nausea and vomiting.  Musculoskeletal: Negative for myalgias.  Neurological: Positive for headaches.       Objective:   Physical Exam Vitals signs and nursing note reviewed.  Constitutional:      General: She is not in acute distress.    Appearance: Normal appearance. She is not toxic-appearing.  HENT:     Head: Normocephalic and atraumatic.     Right Ear: Tympanic membrane is retracted.     Left Ear: Tympanic membrane is retracted.     Nose: Nose normal.     Mouth/Throat:     Mouth: Mucous membranes are moist.     Pharynx: Posterior oropharyngeal erythema (mild) present.  Eyes:     General:        Right eye: No discharge.        Left eye: No discharge.  Neck:     Musculoskeletal: Neck supple. No neck rigidity.  Cardiovascular:     Rate and Rhythm: Normal rate and regular rhythm.     Heart sounds: Normal heart sounds.  Pulmonary:     Effort: Pulmonary effort is normal. No respiratory distress.     Breath sounds: Normal breath sounds. No wheezing or  rales.  Lymphadenopathy:     Cervical: No cervical adenopathy.  Skin:    General: Skin is warm and dry.  Neurological:     Mental Status: She is alert and oriented to person, place, and time.           Assessment & Plan:  Influenza  Discussed likely flu or flu-like viral illness. Past treatment window for tamiflu. Discussed symptomatic care. Warning signs discussed. F/u later this week if not improving or if worse.

## 2018-02-09 NOTE — Patient Instructions (Signed)

## 2018-02-16 ENCOUNTER — Telehealth: Payer: Self-pay | Admitting: Family Medicine

## 2018-02-16 MED ORDER — AZITHROMYCIN 250 MG PO TABS: ORAL_TABLET | ORAL | 0 refills | Status: DC

## 2018-02-16 NOTE — Telephone Encounter (Signed)
Pt's son seen Thursday 02/12/18 and diagnosed with strep throat, pt now has sorethroat, and fever of 101.6 (O), pt would like to know if she could have medication called in. Advise.    Pharmacy:  [facility] [address], [place] - [address]

## 2018-02-16 NOTE — Telephone Encounter (Signed)
zpk

## 2018-02-16 NOTE — Telephone Encounter (Signed)
Prescription sent electronically to pharmacy. Patient notified. 

## 2018-03-05 ENCOUNTER — Ambulatory Visit (INDEPENDENT_AMBULATORY_CARE_PROVIDER_SITE_OTHER): Payer: Managed Care, Other (non HMO) | Admitting: Family Medicine

## 2018-03-05 ENCOUNTER — Encounter: Payer: Self-pay | Admitting: Family Medicine

## 2018-03-05 VITALS — BP 132/86 | Ht 63.0 in | Wt 198.4 lb

## 2018-03-05 DIAGNOSIS — S29019A Strain of muscle and tendon of unspecified wall of thorax, initial encounter: Secondary | ICD-10-CM

## 2018-03-05 MED ORDER — ETODOLAC 400 MG PO TABS: 400.0000 mg | ORAL_TABLET | Freq: Two times a day (BID) | ORAL | 1 refills | Status: DC

## 2018-03-05 NOTE — Progress Notes (Signed)
   Subjective:    Patient ID: Kimberly Everett, female    DOB: 1990/02/25, 28 y.o.   MRN: [id number]  HPI  Patient arrives with mid back pain for 3 days. Patient recalls no injury but states it hurts with movement and breathing.  Pt notrs  severa days ago developed pain right in the center of the back    Felt like it needed to  Pop but didn't  When breathed deep had some discomfort   With certain movement or motion has sig pain   woorse with motion    Took ibuprofen, tried tylenol   No   Not eally cough , but jus the sensation    No fever no chopd  Pt did clean the entire house all weekend, normally gets out and active with the trampolie      No real pre conceived notion, just getting worse, so came on in  Review of Systems No headache, no major weight loss or weight gain, no chest pain no back pain abdominal pain no change in bowel habits complete ROS otherwise negative     Objective:   Physical Exam Alert vitals stable, NAD. Blood pressure good on repeat. HEENT normal. Lungs clear. Heart regular rate and rhythm. No spinal tenderness no CVA tenderness positive right paralumbar tenderness to deep palpation       Assessment & Plan:  Impression thoracic strain/lumbar strain plan local measures discussed.  Anti-inflammatory medicine as needed.  Symptom care discussed warning signs discussed

## 2018-03-16 ENCOUNTER — Encounter: Payer: Self-pay | Admitting: Family Medicine

## 2018-03-16 ENCOUNTER — Ambulatory Visit (INDEPENDENT_AMBULATORY_CARE_PROVIDER_SITE_OTHER): Payer: Managed Care, Other (non HMO) | Admitting: Family Medicine

## 2018-03-16 VITALS — Temp 98.6°F | Wt 200.2 lb

## 2018-03-16 DIAGNOSIS — H9201 Otalgia, right ear: Secondary | ICD-10-CM

## 2018-03-16 DIAGNOSIS — R519 Headache, unspecified: Secondary | ICD-10-CM

## 2018-03-16 DIAGNOSIS — R51 Headache: Secondary | ICD-10-CM | POA: Diagnosis not present

## 2018-03-16 MED ORDER — DICLOFENAC SODIUM 75 MG PO TBEC: 75.0000 mg | DELAYED_RELEASE_TABLET | Freq: Two times a day (BID) | ORAL | 0 refills | Status: DC

## 2018-03-16 NOTE — Progress Notes (Signed)
   Subjective:    Patient ID: Kimberly Everett, female    DOB: 12-30-1990, 28 y.o.   MRN: [id number]  Otalgia   There is pain in both (started as both, now just the right ear) ears. This is a new problem. The current episode started in the past 7 days. Associated symptoms include headaches. Pertinent negatives include no coughing or rhinorrhea. Associated symptoms comments: Muffled hearing, ringing really loud in ears. She has tried ear drops and acetaminophen for the symptoms. The treatment provided no relief.  Patient when tenderness around the right ear tenderness behind the ear radiates up into the temporal region and top of her head on the right side no skin lesions have been seen no drainage sinus pressure or sore throat    Review of Systems  Constitutional: Negative for activity change and fever.  HENT: Positive for ear pain. Negative for congestion and rhinorrhea.   Eyes: Negative for discharge.  Respiratory: Negative for cough, shortness of breath and wheezing.   Cardiovascular: Negative for chest pain.  Neurological: Positive for headaches.       Objective:   Physical Exam Vitals signs and nursing note reviewed.  Constitutional:      Appearance: She is well-developed.  HENT:     Head: Normocephalic.     Nose: Nose normal.     Mouth/Throat:     Pharynx: No oropharyngeal exudate.  Neck:     Musculoskeletal: Neck supple.  Cardiovascular:     Rate and Rhythm: Normal rate.     Heart sounds: Normal heart sounds. No murmur.  Pulmonary:     Effort: Pulmonary effort is normal.     Breath sounds: Normal breath sounds. No wheezing.  Lymphadenopathy:     Cervical: No cervical adenopathy.  Skin:    General: Skin is warm and dry.   Tenderness around the right ear some clicking when she opens and closes no cellulitis noted eardrum is normal temporal region nontender subjective discomfort radiates from around the ear to the temporal region of the head  There is a possibility  there could be occipital neuralgia That patient states is tender in the posterior aspect of the skull on the right side radiates up into the temporal region     Assessment & Plan:  More than likely musculoskeletal possibly related to TMJ joint recommend anti-inflammatory over the course the next 10 to 14 days if progressive troubles or worse may need referral to ENT for further evaluation I doubt intracranial issues or tumors.  Warning signs were discussed in detail.  If double vision vomiting or other problems notify us immediately

## 2018-07-22 ENCOUNTER — Encounter: Payer: Self-pay | Admitting: Family Medicine

## 2018-09-08 ENCOUNTER — Other Ambulatory Visit: Payer: Self-pay

## 2018-09-08 ENCOUNTER — Ambulatory Visit (INDEPENDENT_AMBULATORY_CARE_PROVIDER_SITE_OTHER): Payer: Self-pay | Admitting: Family Medicine

## 2018-09-08 DIAGNOSIS — R21 Rash and other nonspecific skin eruption: Secondary | ICD-10-CM

## 2018-09-08 MED ORDER — DOXYCYCLINE HYCLATE 100 MG PO TABS: 100.0000 mg | ORAL_TABLET | Freq: Two times a day (BID) | ORAL | 0 refills | Status: DC

## 2018-09-08 MED ORDER — FLOVENT HFA 44 MCG/ACT IN AERO: 2.0000 | INHALATION_SPRAY | Freq: Two times a day (BID) | RESPIRATORY_TRACT | 5 refills | Status: DC

## 2018-09-08 MED ORDER — NABUMETONE 750 MG PO TABS: ORAL_TABLET | ORAL | 1 refills | Status: DC

## 2018-09-08 NOTE — Progress Notes (Signed)
   Subjective:  Audio plus video  Patient ID: Kimberly Everett, female    DOB: 1990-09-18, 28 y.o.   MRN: [id number]  Otalgia  There is pain in the right ear. Chronicity: 6 days. There has been no fever. She has tried acetaminophen, NSAIDs and ear drops for the symptoms.  pain comes and goes. Pain is sharp, right side of head hurts.   Break out on chin. Since getting tubes tied in 2016 has had a lot of break outs on face. Pt states it is starting to get painful.   Needs refill on flovent inhaler.   Virtual Visit via Video Note  I connected with Kimberly Everett on 09/08/18 at  9:30 AM EDT by a video enabled telemedicine application and verified that I am speaking with the correct person using two identifiers.  Location: Patient: home Provider: office   I discussed the limitations of evaluation and management by telemedicine and the availability of in person appointments. The patient expressed understanding and agreed to proceed.  History of Present Illness:    Observations/Objective:   Assessment and Plan:   Follow Up Instructions:    I discussed the assessment and treatment plan with the patient. The patient was provided an opportunity to ask questions and all were answered. The patient agreed with the plan and demonstrated an understanding of the instructions.   The patient was advised to call back or seek an in-person evaluation if the symptoms worsen or if the condition fails to improve as anticipated.  I provided 20 minutes of non-face-to-face time during this encounter.  Has also developed an aggravating rash on the chin comes and goes usually pimple-like in nature.  Review of Systems  HENT: Positive for ear pain.        Objective:   Physical Exam   Virtual     Assessment & Plan:  Impression likely neuropathic pain, discussed.  Will prescribe anti-inflammatory medication.  Also will cover with antibiotic for folliculitis/eruption

## 2018-09-12 ENCOUNTER — Encounter: Payer: Self-pay | Admitting: Family Medicine

## 2019-01-18 IMAGING — US US RENAL
1 series · 14 of 25 positions shown · non-contrast
Comparison: None.

CLINICAL DATA: Dysuria.  Renal colic.  Probable kidney stone.

EXAM:
RENAL / URINARY TRACT ULTRASOUND COMPLETE

[Series 1: us renal · 0.25mm/px · 14 of 34 slices shown]
[im 1/34]
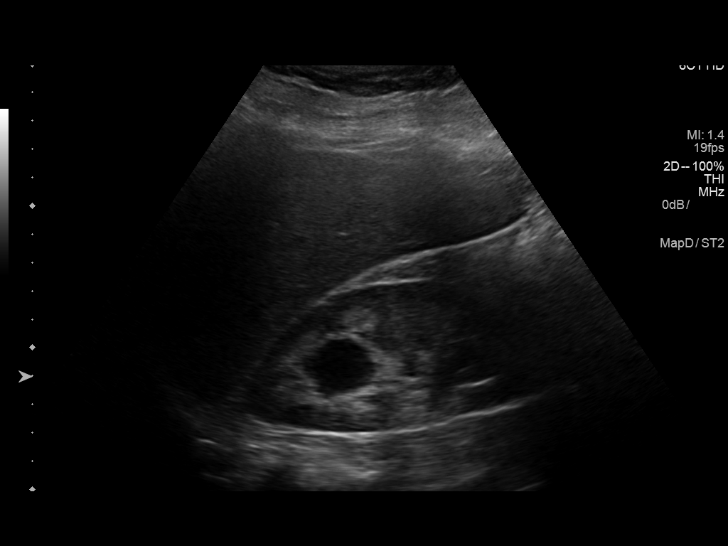
[im 3/34]
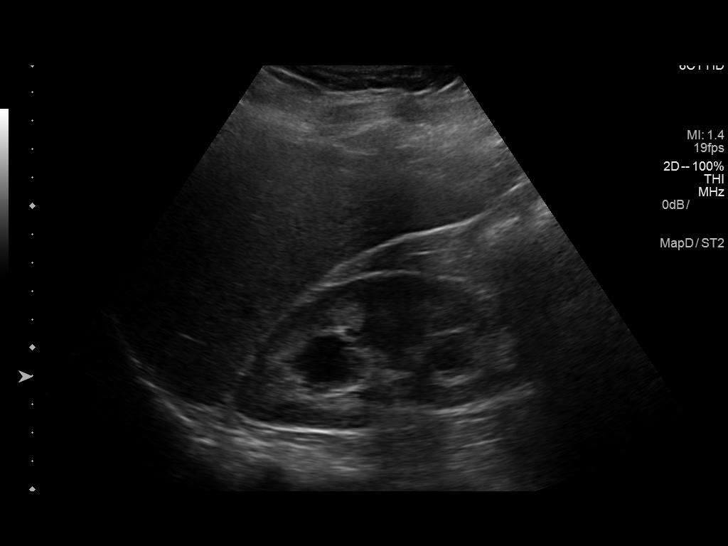
[im 6/34]
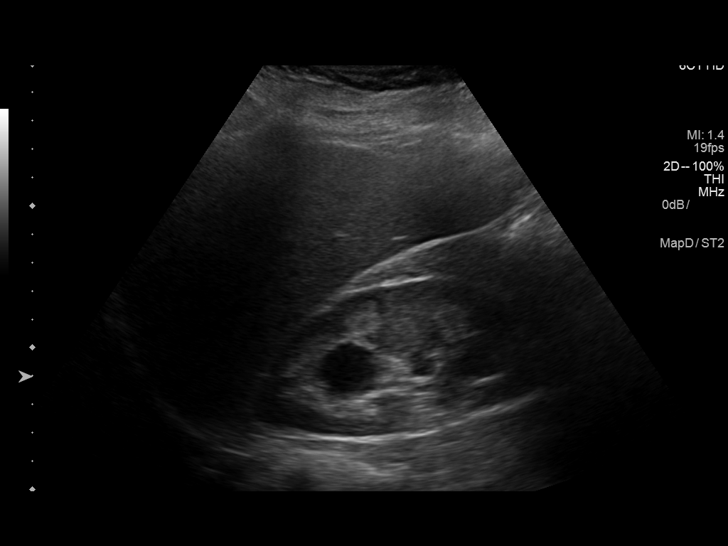
[im 9/34]
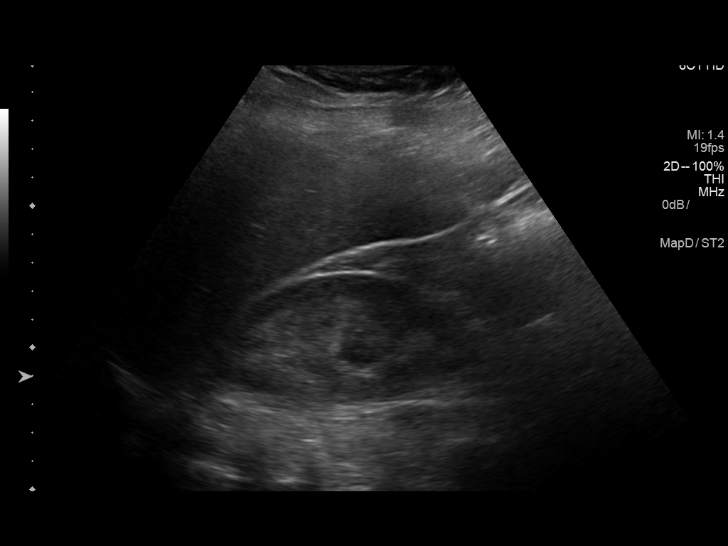
[im 12/34]
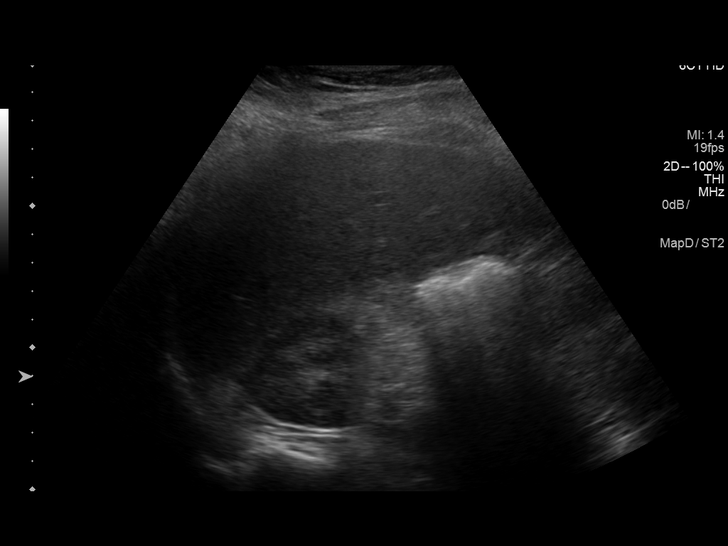
[im 13/34]
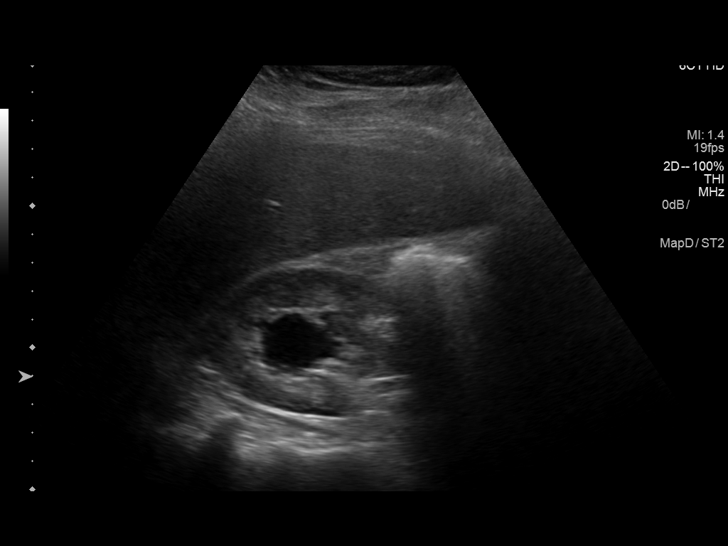
[im 16/34]
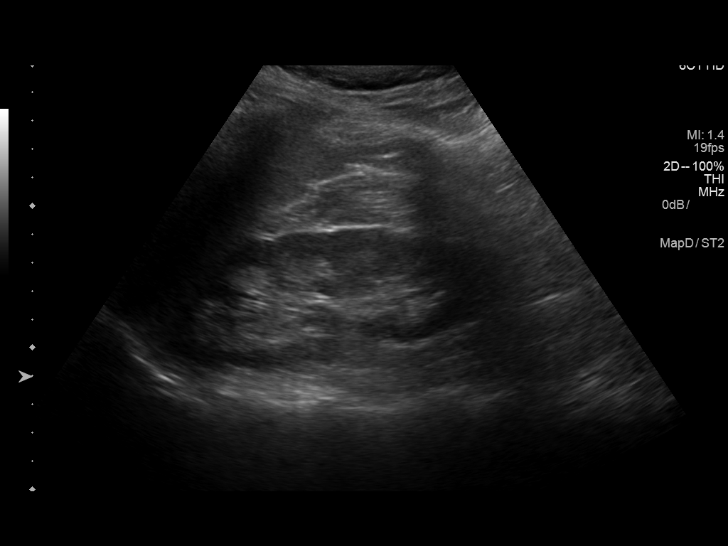
[im 18/34]
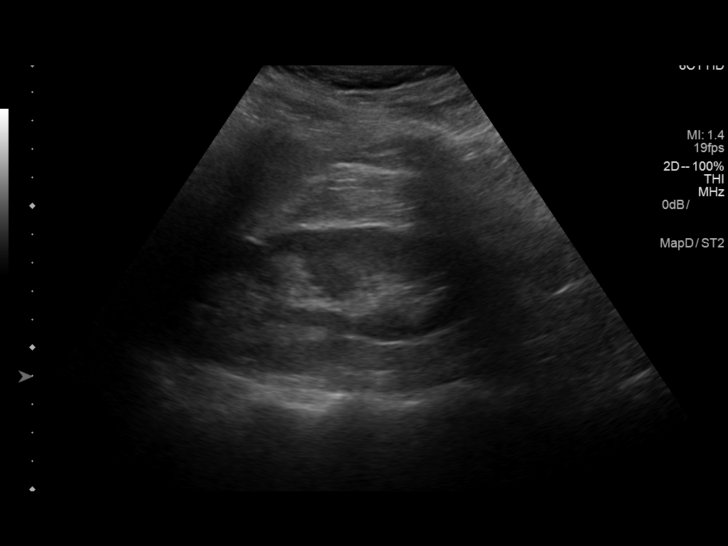
[im 21/34]
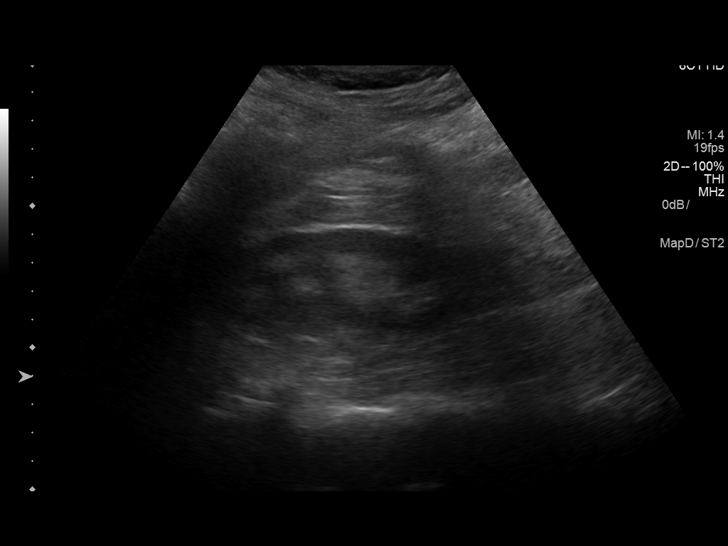
[im 23/34]
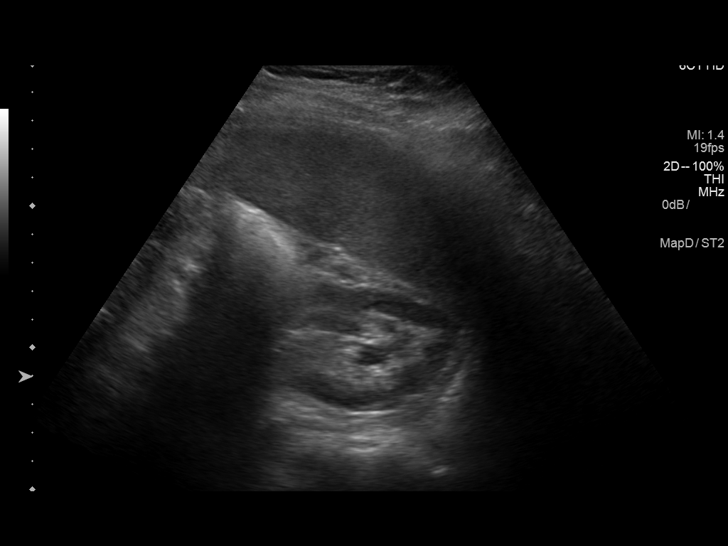
[im 25/34]
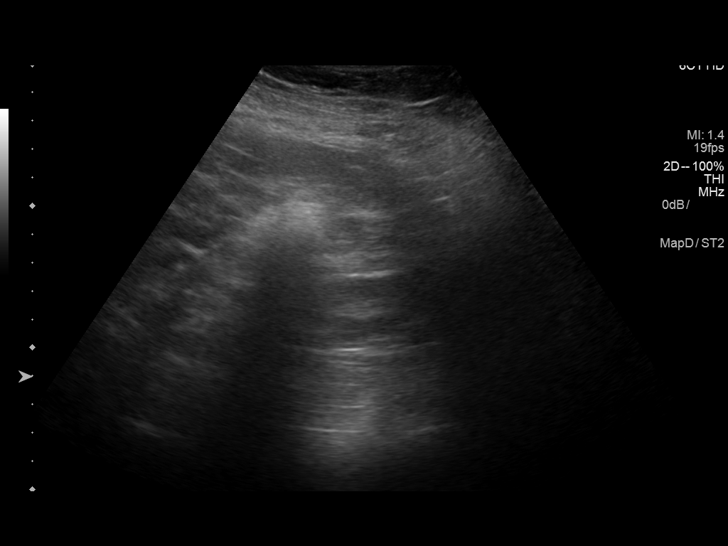
[im 28/34]
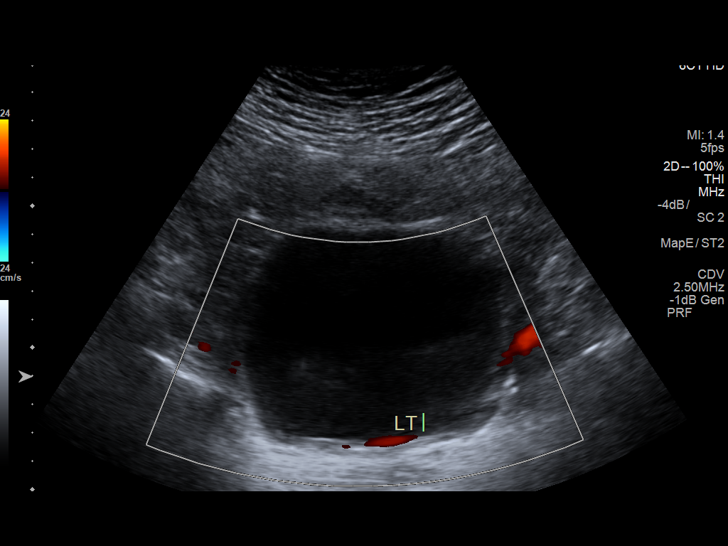
[im 31/34]
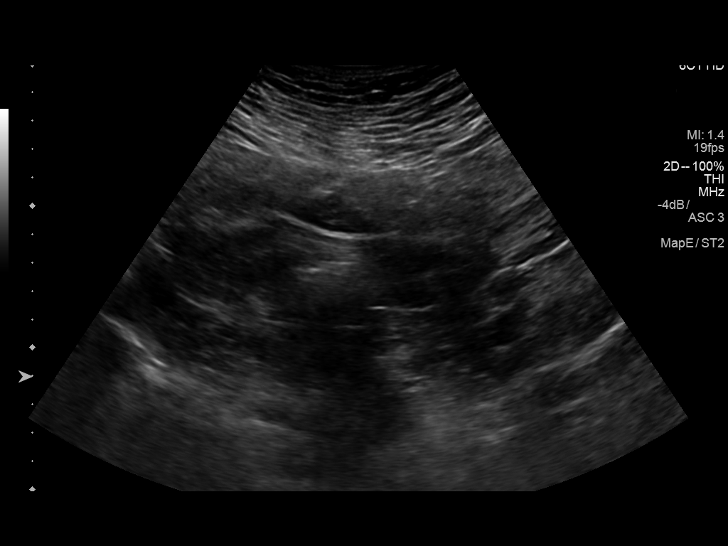
[im 34/34]
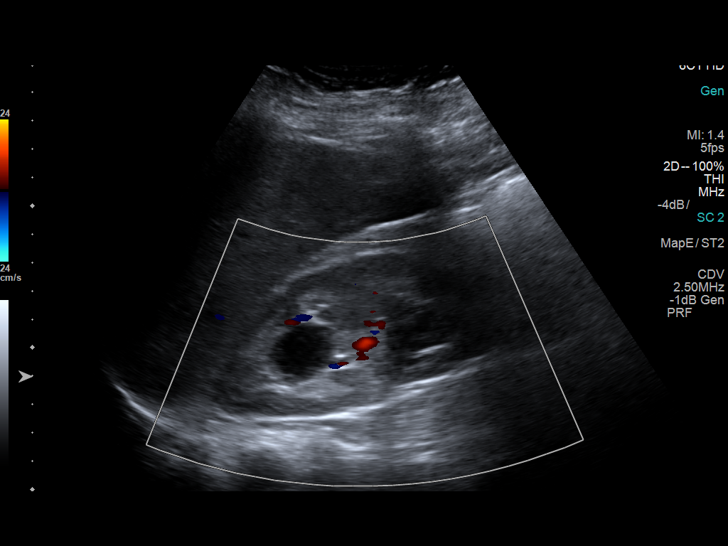

[14 of 25 positions shown; findings below may reference images not displayed]

FINDINGS: Right Kidney:

Length: 11.9 cm. Echogenicity within normal limits. Mild
hydronephrosis, similar appearance to previous ultrasound of
07/12/2013. Questionable additional parapelvic cyst.

Left Kidney:

Length: 12.2 cm. Echogenicity within normal limits. No mass or
hydronephrosis visualized.

Bladder:

Appears normal for degree of bladder distention.
IMPRESSION: 1. Mild RIGHT-sided hydronephrosis, similar to appearance of the
RIGHT kidney on ultrasound of 07/12/2013. Differential includes
chronic mild hydronephrosis related to chronic/congenital partial
UPJ obstruction or sequela of previous obstruction, acute
pelviectasis related to ascending infection, and new or recurrent
obstructing ureteral stone.
2. Normal LEFT kidney.
3. Normal bladder. Both distal ureters are shown to be patent at the
level of the bladder (bilateral ureteral jets visualized).

## 2019-03-01 ENCOUNTER — Encounter: Payer: Self-pay | Admitting: Family Medicine

## 2019-04-13 ENCOUNTER — Encounter: Payer: Self-pay | Admitting: Family Medicine

## 2019-04-16 ENCOUNTER — Ambulatory Visit (INDEPENDENT_AMBULATORY_CARE_PROVIDER_SITE_OTHER): Payer: PRIVATE HEALTH INSURANCE | Admitting: Family Medicine

## 2019-04-16 ENCOUNTER — Other Ambulatory Visit: Payer: Self-pay

## 2019-04-16 VITALS — BP 122/80 | Temp 98.1°F | Wt 212.6 lb

## 2019-04-16 DIAGNOSIS — L700 Acne vulgaris: Secondary | ICD-10-CM | POA: Diagnosis not present

## 2019-04-16 MED ORDER — DOXYCYCLINE HYCLATE 50 MG PO CAPS: 50.0000 mg | ORAL_CAPSULE | Freq: Two times a day (BID) | ORAL | 2 refills | Status: DC

## 2019-04-16 MED ORDER — CLINDAMYCIN PHOSPHATE 1 % EX SOLN: CUTANEOUS | 0 refills | Status: DC

## 2019-04-16 NOTE — Progress Notes (Signed)
   Subjective:    Patient ID: Kimberly Everett, female    DOB: 18-Nov-1990, 29 y.o.   MRN: [id number]  HPI Patient come in today for acne. Patient states she has tried everything over the counter. No other concerns today.  Has been an issue for around a year   At least for six nonts   Minimal  As a teen ager  Hs tried all the otc products  Does not tolerate  Or makes worse or does not help  nonew meds, no cosmetic cheanges       Review of Systems No headache, no major weight loss or weight gain, no chest pain no back pain abdominal pain no change in bowel habits complete ROS otherwise negative     Objective:   Physical Exam  Alert vitals stable, NAD. Blood pressure good on repeat. HEENT normal. Lungs clear. Heart regular rate and rhythm. Impressive acne eruption on the face with inflammatory comedones cheeks chin and forehead      Assessment & Plan:  Impression inflammatory acne.  Discussed.  Initiate Doxy 50 twice daily.  +2 refills.  Also Cleocin T.  Twice daily to affected area.  Since most aggravation is inside of masks encouraged to use hypoallergenic detergent going forward

## 2019-06-08 ENCOUNTER — Other Ambulatory Visit: Payer: Self-pay | Admitting: Family Medicine

## 2019-06-08 NOTE — Telephone Encounter (Signed)
Seen for acne march 2021

## 2019-12-02 ENCOUNTER — Other Ambulatory Visit: Payer: PRIVATE HEALTH INSURANCE

## 2019-12-06 ENCOUNTER — Other Ambulatory Visit (INDEPENDENT_AMBULATORY_CARE_PROVIDER_SITE_OTHER): Payer: PRIVATE HEALTH INSURANCE | Admitting: *Deleted

## 2019-12-06 ENCOUNTER — Other Ambulatory Visit: Payer: Self-pay

## 2019-12-06 DIAGNOSIS — Z23 Encounter for immunization: Secondary | ICD-10-CM

## 2020-08-16 ENCOUNTER — Ambulatory Visit: Payer: Medicaid Other | Admitting: Nurse Practitioner

## 2022-05-20 ENCOUNTER — Telehealth: Payer: 59 | Admitting: Physician Assistant

## 2022-05-20 DIAGNOSIS — J208 Acute bronchitis due to other specified organisms: Secondary | ICD-10-CM

## 2022-05-21 MED ORDER — BENZONATATE 100 MG PO CAPS: 100.0000 mg | ORAL_CAPSULE | Freq: Three times a day (TID) | ORAL | 0 refills | Status: DC | PRN

## 2022-05-21 MED ORDER — PREDNISONE 20 MG PO TABS: 40.0000 mg | ORAL_TABLET | Freq: Every day | ORAL | 0 refills | Status: DC

## 2022-05-21 MED ORDER — IPRATROPIUM BROMIDE 0.03 % NA SOLN: 2.0000 | Freq: Two times a day (BID) | NASAL | 0 refills | Status: DC

## 2022-05-21 NOTE — Progress Notes (Signed)

## 2022-05-21 NOTE — Progress Notes (Signed)
I have spent 5 minutes in review of e-visit questionnaire, review and updating patient chart, medical decision making and response to patient.   Ottie Neglia Cody Lacretia Tindall, PA-C    

## 2022-05-22 ENCOUNTER — Telehealth: Payer: 59 | Admitting: Nurse Practitioner

## 2022-05-22 DIAGNOSIS — Z20818 Contact with and (suspected) exposure to other bacterial communicable diseases: Secondary | ICD-10-CM

## 2022-05-22 MED ORDER — AMOXICILLIN 500 MG PO CAPS: 500.0000 mg | ORAL_CAPSULE | Freq: Two times a day (BID) | ORAL | 0 refills | Status: AC

## 2022-05-22 NOTE — Progress Notes (Signed)
E-Visit for Sore Throat - Strep Symptoms  We are sorry that you are not feeling well.  Here is how we plan to help!  Based on what you have shared with me it is likely that you have strep pharyngitis.  Strep pharyngitis is inflammation and infection in the back of the throat.  This is an infection cause by bacteria and is treated with antibiotics.  I have prescribed Amoxicillin 500 mg twice a day for 10 days. For throat pain, we recommend over the counter oral pain relief medications such as acetaminophen or aspirin, or anti-inflammatory medications such as ibuprofen or naproxen sodium. Topical treatments such as oral throat lozenges or sprays may be used as needed. Strep infections are not as easily transmitted as other respiratory infections, however we still recommend that you avoid close contact with loved ones, especially the very young and elderly.  Remember to wash your hands thoroughly throughout the day as this is the number one way to prevent the spread of infection and wipe down door knobs and counters with disinfectant.   Home Care: Only take medications as instructed by your medical team. Complete the entire course of an antibiotic. Do not take these medications with alcohol. A steam or ultrasonic humidifier can help congestion.  You can place a towel over your head and breathe in the steam from hot water coming from a faucet. Avoid close contacts especially the very young and the elderly. Cover your mouth when you cough or sneeze. Always remember to wash your hands.  Get Help Right Away If: You develop worsening fever or sinus pain. You develop a severe head ache or visual changes. Your symptoms persist after you have completed your treatment plan.  Make sure you Understand these instructions. Will watch your condition. Will get help right away if you are not doing well or get worse.   Thank you for choosing an e-visit.  Your e-visit answers were reviewed by a board  certified advanced clinical practitioner to complete your personal care plan. Depending upon the condition, your plan could have included both over the counter or prescription medications.  Please review your pharmacy choice. Make sure the pharmacy is open so you can pick up prescription now. If there is a problem, you may contact your provider through MyChart messaging and have the prescription routed to another pharmacy.  Your safety is important to us. If you have drug allergies check your prescription carefully.   For the next 24 hours you can use MyChart to ask questions about today's visit, request a non-urgent call back, or ask for a work or school excuse. You will get an email in the next two days asking about your experience. I hope that your e-visit has been valuable and will speed your recovery.   Meds ordered this encounter  Medications   amoxicillin (AMOXIL) 500 MG capsule    Sig: Take 1 capsule (500 mg total) by mouth 2 (two) times daily for 10 days.    Dispense:  20 capsule    Refill:  0     I spent approximately 5 minutes reviewing the patient's history, current symptoms and coordinating their care today.   

## 2022-06-09 ENCOUNTER — Telehealth: Payer: 59 | Admitting: Physician Assistant

## 2022-06-09 DIAGNOSIS — J020 Streptococcal pharyngitis: Secondary | ICD-10-CM

## 2022-06-10 MED ORDER — CLINDAMYCIN HCL 300 MG PO CAPS: 300.0000 mg | ORAL_CAPSULE | Freq: Three times a day (TID) | ORAL | 0 refills | Status: DC

## 2022-06-10 NOTE — Progress Notes (Signed)

## 2022-07-04 ENCOUNTER — Telehealth: Payer: 59 | Admitting: Physician Assistant

## 2022-07-04 DIAGNOSIS — J4521 Mild intermittent asthma with (acute) exacerbation: Secondary | ICD-10-CM | POA: Diagnosis not present

## 2022-07-04 MED ORDER — PREDNISONE 20 MG PO TABS
40.0000 mg | ORAL_TABLET | Freq: Every day | ORAL | 0 refills | Status: DC
Start: 2022-07-04 — End: 2022-08-05

## 2022-07-04 MED ORDER — ALBUTEROL SULFATE HFA 108 (90 BASE) MCG/ACT IN AERS: 2.0000 | INHALATION_SPRAY | Freq: Four times a day (QID) | RESPIRATORY_TRACT | 0 refills | Status: DC | PRN

## 2022-07-04 NOTE — Progress Notes (Signed)
E Visit for Asthma  Based on what you have shared with me, it looks like you may have a flare up of your asthma.  Asthma is a chronic (ongoing) lung disease which results in airway obstruction, inflammation and hyper-responsiveness.   Asthma symptoms vary from person to person, with common symptoms including nighttime awakening and decreased ability to participate in normal activities as a result of shortness of breath. It is often triggered by changes in weather, changes in the season, changes in air temperature, or inside (home, school, daycare or work) allergens such as animal dander, mold, mildew, woodstoves or cockroaches.   It can also be triggered by hormonal changes, extreme emotion, physical exertion or an upper respiratory tract illness.     It is important to identify the trigger, and then eliminate or avoid the trigger if possible.   If you have been prescribed medications to be taken on a regular basis, it is important to follow the asthma action plan and to follow guidelines to adjust medication in response to increasing symptoms of decreased peak expiratory flow rate  Treatment: I have prescribed: Albuterol (Proventil HFA; Ventolin HFA) 108 (90 Base) MCG/ACT Inhaler 2 puffs into the lungs every six hours as needed for wheezing or shortness of breath and Prednisone 40mg by mouth per day for 5 - 7 days  HOME CARE . Only take medications as instructed by your medical team. . Consider wearing a mask or scarf to improve breathing air temperature have been shown to decrease irritation and decrease exacerbations . Get rest. . Taking a steamy shower or using a humidifier may help nasal congestion sand ease sore throat pain. You can place a towel over your head and breathe in the steam from hot water coming from a faucet. . Using a saline nasal spray works much the same way.   . Cough drops, hare candies and sore throat lozenges may ease your cough.  . Avoid close contacts especially the very you and the elderly . Cover your mouth if you cough or sneeze . Always remember to wash your hands.    GET HELP RIGHT AWAY IF: . You develop worsening symptoms; breathlessness at rest, drowsy, confused or agitated, unable to speak in full sentences . You have coughing fits . You develop a severe headache or visual changes . You develop shortness of breath, difficulty breathing or start having chest pain . Your symptoms persist after you have completed your treatment plan . If your symptoms do not improve within 10 days  MAKE SURE YOU . Understand these instructions. . Will watch your condition. . Will get help right away if you are not doing well or get worse.   Your e-visit answers were reviewed by a board certified advanced clinical practitioner to complete your personal care plan, Depending upon the condition, your plan could have included both over the counter or prescription medications.  Please review your pharmacy choice. Your safety is important to us. If you have drug allergies check your prescription carefully. You can use MyChart to ask questions about today's visit, request a non-urgent call back, or ask for a work or school excuse for 24 hours related to this e-Visit. If it has been greater than 24 hours you will need to follow up with your provider, or enter a new e-Visit to address those concerns.  You will get an e-mail in the next two days asking about your experience. I hope that your e-visit has been valuable and will speed your   recovery. Thank you for using e-visits. 

## 2022-07-04 NOTE — Progress Notes (Signed)
I have spent 5 minutes in review of e-visit questionnaire, review and updating patient chart, medical decision making and response to patient.   Fransisco Messmer Cody Puneet Selden, PA-C    

## 2022-07-04 NOTE — Progress Notes (Signed)
Message sent to patient requesting further input regarding current symptoms. Awaiting patient response.  

## 2022-07-16 ENCOUNTER — Telehealth: Payer: 59 | Admitting: Physician Assistant

## 2022-07-16 DIAGNOSIS — K219 Gastro-esophageal reflux disease without esophagitis: Secondary | ICD-10-CM

## 2022-07-16 MED ORDER — OMEPRAZOLE 40 MG PO CPDR
40.0000 mg | DELAYED_RELEASE_CAPSULE | Freq: Every day | ORAL | 3 refills | Status: DC
Start: 2022-07-16 — End: 2023-05-23

## 2022-07-16 NOTE — Progress Notes (Signed)
I have spent 5 minutes in review of e-visit questionnaire, review and updating patient chart, medical decision making and response to patient.   Helene Bernstein Cody Destynie Toomey, PA-C    

## 2022-07-16 NOTE — Progress Notes (Signed)
E-Visit for Heartburn  We are sorry that you are not feeling well.  Here is how we plan to help!  Based on what you shared with me it looks like you most likely have Gastroesophageal Reflux Disease (GERD)  Gastroesophageal reflux disease (GERD) happens when acid from your stomach flows up into the esophagus.  When acid comes in contact with the esophagus, the acid causes sorenss (inflammation) in the esophagus.  Over time, GERD may create small holes (ulcers) in the lining of the esophagus.  I have prescribed Omeprazole 40 mg one by mouth daily until you follow up with a provider.  Your symptoms should improve in the next day or two.  You can use antacids as needed until symptoms resolve.  Call us if your heartburn worsens, you have trouble swallowing, weight loss, spitting up blood or recurrent vomiting.  Home Care: May include lifestyle changes such as weight loss, quitting smoking and alcohol consumption Avoid foods and drinks that make your symptoms worse, such as: Caffeine or alcoholic drinks Chocolate Peppermint or mint flavorings Garlic and onions Spicy foods Citrus fruits, such as oranges, lemons, or limes Tomato-based foods such as sauce, chili, salsa and pizza Fried and fatty foods Avoid lying down for 3 hours prior to your bedtime or prior to taking a nap Eat small, frequent meals instead of a large meals Wear loose-fitting clothing.  Do not wear anything tight around your waist that causes pressure on your stomach. Raise the head of your bed 6 to 8 inches with wood blocks to help you sleep.  Extra pillows will not help.  Seek Help Right Away If: You have pain in your arms, neck, jaw, teeth or back Your pain increases or changes in intensity or duration You develop nausea, vomiting or sweating (diaphoresis) You develop shortness of breath or you faint Your vomit is green, yellow, black or looks like coffee grounds or blood Your stool is red, bloody or black  These  symptoms could be signs of other problems, such as heart disease, gastric bleeding or esophageal bleeding.  Make sure you : Understand these instructions. Will watch your condition. Will get help right away if you are not doing well or get worse.  Your e-visit answers were reviewed by a board certified advanced clinical practitioner to complete your personal care plan.  Depending on the condition, your plan could have included both over the counter or prescription medications.  If there is a problem please reply  once you have received a response from your provider.  Your safety is important to [place].  If you have drug allergies check your prescription carefully.    You can use MyChart to ask questions about today's visit, request a non-urgent call back, or ask for a work or school excuse for 24 hours related to this e-Visit. If it has been greater than 24 hours you will need to follow up with your provider, or enter a new e-Visit to address those concerns.  You will get an e-mail in the next two days asking about your experience.  I hope that your e-visit has been valuable and will speed your recovery. Thank you for using e-visits.

## 2022-08-05 ENCOUNTER — Telehealth: Payer: 59 | Admitting: Physician Assistant

## 2022-08-05 DIAGNOSIS — J020 Streptococcal pharyngitis: Secondary | ICD-10-CM | POA: Diagnosis not present

## 2022-08-05 MED ORDER — AMOXICILLIN 500 MG PO CAPS
500.0000 mg | ORAL_CAPSULE | Freq: Two times a day (BID) | ORAL | 0 refills | Status: AC
Start: 2022-08-05 — End: 2022-08-15

## 2022-08-05 NOTE — Progress Notes (Signed)

## 2022-08-18 ENCOUNTER — Telehealth: Payer: 59 | Admitting: Nurse Practitioner

## 2022-08-18 DIAGNOSIS — H60333 Swimmer's ear, bilateral: Secondary | ICD-10-CM | POA: Diagnosis not present

## 2022-08-18 DIAGNOSIS — J309 Allergic rhinitis, unspecified: Secondary | ICD-10-CM

## 2022-08-18 DIAGNOSIS — H1013 Acute atopic conjunctivitis, bilateral: Secondary | ICD-10-CM | POA: Diagnosis not present

## 2022-08-19 MED ORDER — CIPROFLOXACIN-DEXAMETHASONE 0.3-0.1 % OT SUSP
4.0000 [drp] | Freq: Two times a day (BID) | OTIC | 0 refills | Status: AC
Start: 2022-08-19 — End: 2022-08-24

## 2022-08-19 MED ORDER — FLUTICASONE PROPIONATE 50 MCG/ACT NA SUSP
2.0000 | Freq: Every day | NASAL | 6 refills | Status: DC
Start: 2022-08-19 — End: 2022-11-09

## 2022-08-19 MED ORDER — AZELASTINE HCL 0.05 % OP SOLN: 1.0000 [drp] | Freq: Two times a day (BID) | OPHTHALMIC | 0 refills | Status: DC

## 2022-08-19 MED ORDER — OLOPATADINE HCL 0.1 % OP SOLN
1.0000 [drp] | Freq: Two times a day (BID) | OPHTHALMIC | 12 refills | Status: DC
Start: 2022-08-19 — End: 2022-08-19

## 2022-08-19 NOTE — Progress Notes (Signed)
E Visit for Ear Pain - Swimmer's Ear  We are sorry that you are not feeling well. Here is how we plan to help!  We are prescribing ear drops and eye drops & a nasal spray  Meds ordered this encounter  Medications   ciprofloxacin-dexamethasone (CIPRODEX) OTIC suspension    Sig: Place 4 drops into both ears 2 (two) times daily for 5 days.    Dispense:  7.5 mL    Refill:  0   olopatadine (PATANOL) 0.1 % ophthalmic solution    Sig: Place 1 drop into both eyes 2 (two) times daily.    Dispense:  5 mL    Refill:  12     Based on what you have shared with me it looks like you have Swimmer's Ear.  Swimmer's ear is a redness or swelling, irritation, or infection of your outer ear canal. These symptoms usually occur within a few days of swimming. Your ear canal is a tube that goes from the opening of the ear to the eardrum.  When water stays in your ear canal, germs can grow.  This is a painful condition that often happens to children and swimmers of all ages.  It is not contagious and oral antibiotics are not required to treat uncomplicated swimmer's ear.  The usual symptoms include:    Itchiness inside the ear  Redness or a sense of swelling in the ear  Pain when the ear is tugged on when pressure is placed on the ear  Pus draining from the infected ear    In certain cases, swimmer's ear may progress to a more serious bacterial infection of the middle or inner ear.  If you have a fever 102 and up and significantly worsening symptoms, this could indicate a more serious infection moving to the middle/inner and needs face to face evaluation in an office by a provider.  Your symptoms should improve over the next 3 days and should resolve in about 7 days.  Be sure to complete ALL of your prescription.  HOME CARE: Wash your hands frequently. If you are prescribed an ear drop, do not place the tip of the bottle on your ear or touch it with your fingers. You can take Acetaminophen 650 mg every 4-6  hours as needed for pain.  If pain is severe or moderate, you can apply a heating pad (set on low) or hot water bottle (wrapped in a towel) to outer ear for 20 minutes.  This will also increase drainage. Avoid ear plugs Do not go swimming until the symptoms are gone Do not use Q-tips After showers, help the water run out by tilting your head to one side.   GET HELP RIGHT AWAY IF: Fever is over 102.2 degrees. You develop progressive ear pain or hearing loss. Ear symptoms persist longer than 3 days after treatment.  MAKE SURE YOU: Understand these instructions. Will watch your condition. Will get help right away if you are not doing well or get worse.  TO PREVENT SWIMMER'S EAR: Use a bathing cap or custom fitted swim molds to keep your ears dry. Towel off after swimming to dry your ears. Tilt your head or pull your earlobes to allow the water to escape your ear canal. If there is still water in your ears, consider using a hairdryer on the lowest setting.  E visit for Allergic Rhinitis We are sorry that you are not feeling well.  Here is how we plan to help!  Based on what  you have shared with me it looks like you have Allergic Rhinitis.  Rhinitis is when a reaction occurs that causes nasal congestion, runny nose, sneezing, and itching.  Most types of rhinitis are caused by an inflammation and are associated with symptoms in the eyes ears or throat. There are several types of rhinitis.  The most common are acute rhinitis, which is usually caused by a viral illness, allergic or seasonal rhinitis, and nonallergic or year-round rhinitis.  Nasal allergies occur certain times of the year.  Allergic rhinitis is caused when allergens in the air trigger the release of histamine in the body.  Histamine causes itching, swelling, and fluid to build up in the fragile linings of the nasal passages, sinuses and eyelids.  An itchy nose and clear discharge are common.  I recommend the following over the  counter treatments: Continue Zyrtec daily   I also would recommend a nasal spray: Flonase 2 sprays into each nostril once daily  You may also benefit from eye drops such as: Patanol prescription sent to pharmacy   HOME CARE:  You can use an over-the-counter saline nasal spray as needed Avoid areas where there is heavy dust, mites, or molds Stay indoors on windy days during the pollen season Keep windows closed in home, at least in bedroom; use air conditioner. Use high-efficiency house air filter Keep windows closed in car, turn AC on re-circulate Avoid playing out with dog during pollen season  GET HELP RIGHT AWAY IF:  If your symptoms do not improve within 10 days You become short of breath You develop yellow or green discharge from your nose for over 3 days You have coughing fits  MAKE SURE YOU:  Understand these instructions Will watch your condition Will get help right away if you are not doing well or get worse  Thank you for choosing an e-visit. Your e-visit answers were reviewed by a board certified advanced clinical practitioner to complete your personal care plan. Depending upon the condition, your plan could have included both over the counter or prescription medications. Please review your pharmacy choice. Be sure that the pharmacy you have chosen is open so that you can pick up your prescription now.  If there is a problem you may message your provider in MyChart to have the prescription routed to another pharmacy. Your safety is important to [place]. If you have drug allergies check your prescription carefully.  For the next 24 hours, you can use MyChart to ask questions about today's visit, request a non-urgent call back, or ask for a work or school excuse from your e-visit provider. You will get an email in the next two days asking about your experience. I hope that your e-visit has been valuable and will speed your recovery.  I spent approximately 5 minutes  reviewing the patient's history, current symptoms and coordinating their care today.

## 2022-08-19 NOTE — Addendum Note (Signed)
Addended by: Waldon Merl on: 08/19/2022 01:36 PM   Modules accepted: Orders

## 2022-08-23 ENCOUNTER — Telehealth: Payer: 59 | Admitting: Family Medicine

## 2022-08-23 DIAGNOSIS — J020 Streptococcal pharyngitis: Secondary | ICD-10-CM

## 2022-08-23 MED ORDER — CLINDAMYCIN HCL 300 MG PO CAPS: 300.0000 mg | ORAL_CAPSULE | Freq: Three times a day (TID) | ORAL | 0 refills | Status: AC

## 2022-08-23 NOTE — Progress Notes (Signed)
E-Visit for Sore Throat - Strep Symptoms  We are sorry that you are not feeling well.  Here is how we plan to help!  Based on what you have shared with me it is likely that you have strep pharyngitis.  Strep pharyngitis is inflammation and infection in the back of the throat.  This is an infection cause by bacteria and is treated with antibiotics.  I have prescribed Clindamycin 300 mg three times a day for 10 days. For throat pain, we recommend over the counter oral pain relief medications such as acetaminophen or aspirin, or anti-inflammatory medications such as ibuprofen or naproxen sodium. Topical treatments such as oral throat lozenges or sprays may be used as needed. Strep infections are not as easily transmitted as other respiratory infections, however we still recommend that you avoid close contact with loved ones, especially the very young and elderly.  Remember to wash your hands thoroughly throughout the day as this is the number one way to prevent the spread of infection and wipe down door knobs and counters with disinfectant.   Home Care: Only take medications as instructed by your medical team. Complete the entire course of an antibiotic. Do not take these medications with alcohol. A steam or ultrasonic humidifier can help congestion.  You can place a towel over your head and breathe in the steam from hot water coming from a faucet. Avoid close contacts especially the very young and the elderly. Cover your mouth when you cough or sneeze. Always remember to wash your hands.  Get Help Right Away If: You develop worsening fever or sinus pain. You develop a severe head ache or visual changes. Your symptoms persist after you have completed your treatment plan.  Make sure you Understand these instructions. Will watch your condition. Will get help right away if you are not doing well or get worse.   Thank you for choosing an e-visit.  Your e-visit answers were reviewed by a board  certified advanced clinical practitioner to complete your personal care plan. Depending upon the condition, your plan could have included both over the counter or prescription medications.  Please review your pharmacy choice. Make sure the pharmacy is open so you can pick up prescription now. If there is a problem, you may contact your provider through [employer] and have the prescription routed to another pharmacy.  Your safety is important to [place]. If you have drug allergies check your prescription carefully.   For the next 24 hours you can use MyChart to ask questions about today's visit, request a non-urgent call back, or ask for a work or school excuse. You will get an email in the next two days asking about your experience. I hope that your e-visit has been valuable and will speed your recovery.    have provided 5 minutes of non face to face time during this encounter for chart review and documentation.

## 2022-08-28 ENCOUNTER — Telehealth: Payer: 59 | Admitting: Nurse Practitioner

## 2022-08-28 ENCOUNTER — Other Ambulatory Visit: Payer: Self-pay | Admitting: Nurse Practitioner

## 2022-08-28 DIAGNOSIS — H00021 Hordeolum internum right upper eyelid: Secondary | ICD-10-CM | POA: Diagnosis not present

## 2022-08-28 MED ORDER — BACITRACIN-POLYMYXIN B 500-10000 UNIT/GM OP OINT
1.0000 | TOPICAL_OINTMENT | Freq: Four times a day (QID) | OPHTHALMIC | 0 refills | Status: AC
Start: 2022-08-28 — End: 2022-09-02

## 2022-08-28 MED ORDER — NEOMYCIN-POLYMYXIN-HC 3.5-10000-1 OP SUSP
3.0000 [drp] | Freq: Four times a day (QID) | OPHTHALMIC | 0 refills | Status: AC
Start: 2022-08-28 — End: 2022-09-02

## 2022-08-28 NOTE — Progress Notes (Signed)
  E-Visit for Stye     Based on what you have shared with me it looks like you have a stye.  A stye is an inflammation of the eyelid.  It is often a red, painful lump near the edge of the eyelid that may look like a boil or a pimple.  A stye develops when an infection occurs at the base of an eyelash.   This may also be from allergies. I would continue Zyrtec in the evening and you can take one Claritin in the morning for the next week. Also continue to use the Flonase nasal spray daily.  Continue the warm compresses   If you wear contacts I would suggest taking a break and using glasses   We have made appropriate suggestions for you based upon your presentation: Your symptoms may indicate an infection of the sclera.  The use of anti-inflammatory and antibiotic eye drops for a week will help resolve this condition.  I have sent in neomycin-polymyxin HC opthalmic suspension, two to three drops in the affected eye four times daily.  If your symptoms do not improve over the next two to three days you should be seen in your doctor's office.  HOME CARE:  Wash your hands often! Let the stye open on its own. Don't squeeze or open it. Don't rub your eyes. This can irritate your eyes and let in bacteria.  If you need to touch your eyes, wash your hands first. Don't wear eye makeup or contact lenses until the area has healed.  GET HELP RIGHT AWAY IF:  Your symptoms do not improve. You develop blurred or loss of vision. Your symptoms worsen (increased discharge, pain or redness).   Thank you for choosing an e-visit.  Your e-visit answers were reviewed by a board certified advanced clinical practitioner to complete your personal care plan. Depending upon the condition, your plan could have included both over the counter or prescription medications.  Please review your pharmacy choice. Make sure the pharmacy is open so you can pick up prescription now. If there is a problem, you may contact your  provider through [employer] and have the prescription routed to another pharmacy.  Your safety is important to [place]. If you have drug allergies check your prescription carefully.   For the next 24 hours you can use MyChart to ask questions about today's visit, request a non-urgent call back, or ask for a work or school excuse. You will get an email in the next two days asking about your experience. I hope that your e-visit has been valuable and will speed your recovery.   Meds ordered this encounter  Medications   neomycin-polymyxin-hydrocortisone (CORTISPORIN) 3.5-10000-1 ophthalmic suspension    Sig: Place 3 drops into the right eye 4 (four) times daily for 5 days.    Dispense:  7.5 mL    Refill:  0    I spent approximately 5 minutes reviewing the patient's history, current symptoms and coordinating their care today.

## 2022-08-28 NOTE — Addendum Note (Signed)
Addended by: Harlow Mares on: 08/28/2022 08:59 AM   Modules accepted: Orders

## 2022-09-17 ENCOUNTER — Telehealth: Payer: 59 | Admitting: Family Medicine

## 2022-09-17 DIAGNOSIS — J029 Acute pharyngitis, unspecified: Secondary | ICD-10-CM

## 2022-09-17 DIAGNOSIS — J069 Acute upper respiratory infection, unspecified: Secondary | ICD-10-CM

## 2022-09-17 NOTE — Progress Notes (Signed)
Because you have had strep treatment several times and recently as in the last month- we should have you seen in person for testing purposes and to rule out covid as well. Your condition warrants further evaluation and I recommend that you be seen in a face to face visit.   NOTE: There will be NO CHARGE for this eVisit   If you are having a true medical emergency please call 911.

## 2022-11-08 ENCOUNTER — Telehealth: Payer: Medicaid Other | Admitting: Physician Assistant

## 2022-11-08 DIAGNOSIS — J029 Acute pharyngitis, unspecified: Secondary | ICD-10-CM | POA: Diagnosis not present

## 2022-11-09 MED ORDER — AMOXICILLIN 500 MG PO CAPS
500.0000 mg | ORAL_CAPSULE | Freq: Two times a day (BID) | ORAL | 0 refills | Status: AC
Start: 2022-11-09 — End: 2022-11-19

## 2022-11-09 NOTE — Progress Notes (Signed)

## 2022-11-21 ENCOUNTER — Telehealth: Payer: Medicaid Other | Admitting: Physician Assistant

## 2022-11-21 DIAGNOSIS — J020 Streptococcal pharyngitis: Secondary | ICD-10-CM | POA: Diagnosis not present

## 2022-11-22 MED ORDER — CEFDINIR 300 MG PO CAPS
300.0000 mg | ORAL_CAPSULE | Freq: Two times a day (BID) | ORAL | 0 refills | Status: DC
Start: 2022-11-22 — End: 2023-01-29

## 2022-11-22 NOTE — Progress Notes (Signed)
E-Visit for Sore Throat - Strep Symptoms  We are sorry that you are not feeling well.  Here is how we plan to help!  Based on what you have shared with me it is likely that you have strep pharyngitis.  Strep pharyngitis is inflammation and infection in the back of the throat.  This is an infection cause by bacteria and is treated with antibiotics.  I have prescribed Cefdinir 300 mg two times a day for 10 days. For throat pain, we recommend over the counter oral pain relief medications such as acetaminophen or aspirin, or anti-inflammatory medications such as ibuprofen or naproxen sodium. Topical treatments such as oral throat lozenges or sprays may be used as needed. Strep infections are not as easily transmitted as other respiratory infections, however we still recommend that you avoid close contact with loved ones, especially the very young and elderly.  Remember to wash your hands thoroughly throughout the day as this is the number one way to prevent the spread of infection and wipe down door knobs and counters with disinfectant.   Home Care: Only take medications as instructed by your medical team. Complete the entire course of an antibiotic. Do not take these medications with alcohol. A steam or ultrasonic humidifier can help congestion.  You can place a towel over your head and breathe in the steam from hot water coming from a faucet. Avoid close contacts especially the very young and the elderly. Cover your mouth when you cough or sneeze. Always remember to wash your hands.  Get Help Right Away If: You develop worsening fever or sinus pain. You develop a severe head ache or visual changes. Your symptoms persist after you have completed your treatment plan.  Make sure you Understand these instructions. Will watch your condition. Will get help right away if you are not doing well or get worse.   Thank you for choosing an e-visit.  Your e-visit answers were reviewed by a board  certified advanced clinical practitioner to complete your personal care plan. Depending upon the condition, your plan could have included both over the counter or prescription medications.  Please review your pharmacy choice. Make sure the pharmacy is open so you can pick up prescription now. If there is a problem, you may contact your provider through [employer] and have the prescription routed to another pharmacy.  Your safety is important to [place]. If you have drug allergies check your prescription carefully.   For the next 24 hours you can use MyChart to ask questions about today's visit, request a non-urgent call back, or ask for a work or school excuse. You will get an email in the next two days asking about your experience. I hope that your e-visit has been valuable and will speed your recovery.  I have spent 5 minutes in review of e-visit questionnaire, review and updating patient chart, medical decision making and response to patient.   Margaretann Loveless, PA-C

## 2022-12-08 ENCOUNTER — Telehealth: Payer: Medicaid Other | Admitting: Emergency Medicine

## 2022-12-08 DIAGNOSIS — R12 Heartburn: Secondary | ICD-10-CM

## 2022-12-09 NOTE — Progress Notes (Signed)
Because this is a recurrent problem, I feel your condition warrants further evaluation and I recommend that you be seen for a face to face visit.  Please contact your primary care physician practice to be seen. Many offices offer virtual options to be seen via video if you are not comfortable going in person to a medical facility at this time.  NOTE: You will NOT be charged for this eVisit.  If you do not have a PCP, [facility] offers a free physician referral service available at [phone]. Our trained staff has the experience, knowledge and resources to put you in touch with a physician who is right for you.    If you are having a true medical emergency please call 911.   Your e-visit answers were reviewed by a board certified advanced clinical practitioner to complete your personal care plan.  Thank you for using e-Visits.

## 2023-01-29 ENCOUNTER — Telehealth: Payer: Medicaid Other | Admitting: Physician Assistant

## 2023-01-29 DIAGNOSIS — J020 Streptococcal pharyngitis: Secondary | ICD-10-CM

## 2023-01-29 MED ORDER — AMOXICILLIN-POT CLAVULANATE 875-125 MG PO TABS
1.0000 | ORAL_TABLET | Freq: Two times a day (BID) | ORAL | 0 refills | Status: DC
Start: 2023-01-29 — End: 2023-03-20

## 2023-01-29 NOTE — Progress Notes (Signed)
 E-Visit for Sore Throat - Strep Symptoms  We are sorry that you are not feeling well.  Here is how we plan to help!  Based on what you have shared with me it is likely that you have strep pharyngitis.  Strep pharyngitis is inflammation and infection in the back of the throat.  This is an infection cause by bacteria and is treated with antibiotics.  I have prescribed Augmentin 875-125 mg twice a day for 10 days For throat pain, we recommend over the counter oral pain relief medications such as acetaminophen or aspirin, or anti-inflammatory medications such as ibuprofen or naproxen sodium. Topical treatments such as oral throat lozenges or sprays may be used as needed. Strep infections are not as easily transmitted as other respiratory infections, however we still recommend that you avoid close contact with loved ones, especially the very young and elderly.  Remember to wash your hands thoroughly throughout the day as this is the number one way to prevent the spread of infection and wipe down door knobs and counters with disinfectant.   Home Care: Only take medications as instructed by your medical team. Complete the entire course of an antibiotic. Do not take these medications with alcohol. A steam or ultrasonic humidifier can help congestion.  You can place a towel over your head and breathe in the steam from hot water coming from a faucet. Avoid close contacts especially the very young and the elderly. Cover your mouth when you cough or sneeze. Always remember to wash your hands.  Get Help Right Away If: You develop worsening fever or sinus pain. You develop a severe head ache or visual changes. Your symptoms persist after you have completed your treatment plan.  Make sure you Understand these instructions. Will watch your condition. Will get help right away if you are not doing well or get worse.   Thank you for choosing an e-visit.  Your e-visit answers were reviewed by a board  certified advanced clinical practitioner to complete your personal care plan. Depending upon the condition, your plan could have included both over the counter or prescription medications.  Please review your pharmacy choice. Make sure the pharmacy is open so you can pick up prescription now. If there is a problem, you may contact your provider through [employer] and have the prescription routed to another pharmacy.  Your safety is important to [place]. If you have drug allergies check your prescription carefully.   For the next 24 hours you can use MyChart to ask questions about today's visit, request a non-urgent call back, or ask for a work or school excuse. You will get an email in the next two days asking about your experience. I hope that your e-visit has been valuable and will speed your recovery.   I have spent 5 minutes in review of e-visit questionnaire, review and updating patient chart, medical decision making and response to patient.   Margaretann Loveless, PA-C

## 2023-03-20 ENCOUNTER — Telehealth: Payer: Medicaid Other | Admitting: Physician Assistant

## 2023-03-20 DIAGNOSIS — J020 Streptococcal pharyngitis: Secondary | ICD-10-CM | POA: Diagnosis not present

## 2023-03-20 MED ORDER — AMOXICILLIN 500 MG PO TABS
500.0000 mg | ORAL_TABLET | Freq: Two times a day (BID) | ORAL | 0 refills | Status: AC
Start: 2023-03-20 — End: 2023-03-30

## 2023-03-20 NOTE — Progress Notes (Signed)

## 2023-03-20 NOTE — Progress Notes (Signed)
 I have spent 5 minutes in review of e-visit questionnaire, review and updating patient chart, medical decision making and response to patient.   Piedad Climes, PA-C

## 2023-04-02 DIAGNOSIS — F411 Generalized anxiety disorder: Secondary | ICD-10-CM | POA: Diagnosis not present

## 2023-04-02 DIAGNOSIS — F330 Major depressive disorder, recurrent, mild: Secondary | ICD-10-CM | POA: Diagnosis not present

## 2023-04-06 ENCOUNTER — Telehealth: Admitting: Family

## 2023-04-06 DIAGNOSIS — H60501 Unspecified acute noninfective otitis externa, right ear: Secondary | ICD-10-CM | POA: Diagnosis not present

## 2023-04-06 MED ORDER — CIPROFLOXACIN-DEXAMETHASONE 0.3-0.1 % OT SUSP: 4.0000 [drp] | Freq: Two times a day (BID) | OTIC | 0 refills | Status: DC

## 2023-04-06 MED ORDER — AMOXICILLIN-POT CLAVULANATE 875-125 MG PO TABS: 1.0000 | ORAL_TABLET | Freq: Two times a day (BID) | ORAL | 0 refills | Status: DC

## 2023-04-06 NOTE — Progress Notes (Signed)
 E Visit for Ear Pain - Swimmer's Ear  We are sorry that you are not feeling well. Here is how we plan to help!  Based on what you have shared with me it looks like you have Swimmer's Ear.  Swimmer's ear is a redness or swelling, irritation, or infection of your outer ear canal. These symptoms usually occur within a few days of swimming. Your ear canal is a tube that goes from the opening of the ear to the eardrum.  When water stays in your ear canal, germs can grow.  This is a painful condition that often happens to children and swimmers of all ages.  It is not contagious and oral antibiotics are not required to treat uncomplicated swimmer's ear.  The usual symptoms include:    Itchiness inside the ear  Redness or a sense of swelling in the ear  Pain when the ear is tugged on when pressure is placed on the ear  Pus draining from the infected ear   I have prescribed Augmentin 875-125 mg one tablet by mouth twice a day for 10 days  I have prescribed: Ciprofloxin 0.2% and dexamethasone otic suspension 4 drops in affected ears twice daily for 7 days  In certain cases, swimmer's ear may progress to a more serious bacterial infection of the middle or inner ear.  If you have a fever 102 and up and significantly worsening symptoms, this could indicate a more serious infection moving to the middle/inner and needs face to face evaluation in an office by a provider.  Your symptoms should improve over the next 3 days and should resolve in about 7 days.  Be sure to complete ALL of your prescription.  HOME CARE: Wash your hands frequently. If you are prescribed an ear drop, do not place the tip of the bottle on your ear or touch it with your fingers. You can take Acetaminophen 650 mg every 4-6 hours as needed for pain.  If pain is severe or moderate, you can apply a heating pad (set on low) or hot water bottle (wrapped in a towel) to outer ear for 20 minutes.  This will also increase drainage. Avoid ear  plugs Do not go swimming until the symptoms are gone Do not use Q-tips After showers, help the water run out by tilting your head to one side.   GET HELP RIGHT AWAY IF: Fever is over 102.2 degrees. You develop progressive ear pain or hearing loss. Ear symptoms persist longer than 3 days after treatment.  MAKE SURE YOU: Understand these instructions. Will watch your condition. Will get help right away if you are not doing well or get worse.  TO PREVENT SWIMMER'S EAR: Use a bathing cap or custom fitted swim molds to keep your ears dry. Towel off after swimming to dry your ears. Tilt your head or pull your earlobes to allow the water to escape your ear canal. If there is still water in your ears, consider using a hairdryer on the lowest setting.  Thank you for choosing an e-visit.  Your e-visit answers were reviewed by a board certified advanced clinical practitioner to complete your personal care plan. Depending upon the condition, your plan could have included both over the counter or prescription medications.  Please review your pharmacy choice. Make sure the pharmacy is open so you can pick up the prescription now. If there is a problem, you may contact your provider through [employer] and have the prescription routed to another pharmacy.  Your  safety is important to [place]. If you have drug allergies check your prescription carefully.   For the next 24 hours you can use MyChart to ask questions about today's visit, request a non-urgent call back, or ask for a work or school excuse. You will get an email with a survey after your eVisit asking about your experience. We would appreciate your feedback. I hope that your e-visit has been valuable and will aid in your recovery.   Approximately 5 minutes was spent documenting and reviewing patient's chart.

## 2023-04-24 DIAGNOSIS — F330 Major depressive disorder, recurrent, mild: Secondary | ICD-10-CM | POA: Diagnosis not present

## 2023-04-24 DIAGNOSIS — F411 Generalized anxiety disorder: Secondary | ICD-10-CM | POA: Diagnosis not present

## 2023-05-20 DIAGNOSIS — F411 Generalized anxiety disorder: Secondary | ICD-10-CM | POA: Diagnosis not present

## 2023-05-20 DIAGNOSIS — F330 Major depressive disorder, recurrent, mild: Secondary | ICD-10-CM | POA: Diagnosis not present

## 2023-05-23 ENCOUNTER — Telehealth: Admitting: Physician Assistant

## 2023-05-23 DIAGNOSIS — J4521 Mild intermittent asthma with (acute) exacerbation: Secondary | ICD-10-CM | POA: Diagnosis not present

## 2023-05-23 MED ORDER — ALBUTEROL SULFATE HFA 108 (90 BASE) MCG/ACT IN AERS
2.0000 | INHALATION_SPRAY | Freq: Four times a day (QID) | RESPIRATORY_TRACT | 0 refills | Status: DC | PRN
Start: 2023-05-23 — End: 2023-12-21

## 2023-05-23 MED ORDER — PREDNISONE 20 MG PO TABS
40.0000 mg | ORAL_TABLET | Freq: Every day | ORAL | 0 refills | Status: DC
Start: 2023-05-23 — End: 2023-06-02

## 2023-05-23 NOTE — Progress Notes (Signed)

## 2023-06-02 ENCOUNTER — Telehealth: Admitting: Physician Assistant

## 2023-06-02 DIAGNOSIS — M545 Low back pain, unspecified: Secondary | ICD-10-CM | POA: Diagnosis not present

## 2023-06-02 MED ORDER — BACLOFEN 10 MG PO TABS
10.0000 mg | ORAL_TABLET | Freq: Three times a day (TID) | ORAL | 0 refills | Status: DC
Start: 2023-06-02 — End: 2023-06-16

## 2023-06-02 MED ORDER — NAPROXEN 500 MG PO TABS
500.0000 mg | ORAL_TABLET | Freq: Two times a day (BID) | ORAL | 0 refills | Status: DC
Start: 2023-06-02 — End: 2023-06-16

## 2023-06-02 NOTE — Progress Notes (Signed)
 E-Visit for Back Pain   We are sorry that you are not feeling well.  Here is how we plan to help!  Based on what you have shared with me it looks like you mostly have acute back pain.  Acute back pain is defined as musculoskeletal pain that can resolve in 1-3 weeks with conservative treatment.  I have prescribed Naprosyn  500 mg take one by mouth twice a day non-steroid anti-inflammatory (NSAID) as well as Baclofen  10 mg every eight hours as needed which is a muscle relaxer  Some patients experience stomach irritation or in increased heartburn with anti-inflammatory drugs.  Please keep in mind that muscle relaxer's can cause fatigue and should not be taken while at work or driving.  Back pain is very common.  The pain often gets better over time.  The cause of back pain is usually not dangerous.  Most people can learn to manage their back pain on their own.  Home Care Stay active.  Start with short walks on flat ground if you can.  Try to walk farther each day. Do not sit, drive or stand in one place for more than 30 minutes.  Do not stay in bed. Do not avoid exercise or work.  Activity can help your back heal faster. Be careful when you bend or lift an object.  Bend at your knees, keep the object close to you, and do not twist. Sleep on a firm mattress.  Lie on your side, and bend your knees.  If you lie on your back, put a pillow under your knees. Only take medicines as told by your doctor. Put ice on the injured area. Put ice in a plastic bag Place a towel between your skin and the bag Leave the ice on for 15-20 minutes, 3-4 times a day for the first 2-3 days. 210 After that, you can switch between ice and heat packs. Ask your doctor about back exercises or massage. Avoid feeling anxious or stressed.  Find good ways to deal with stress, such as exercise.  Get Help Right Way If: Your pain does not go away with rest or medicine. Your pain does not go away in 1 week. You have new  problems. You do not feel well. The pain spreads into your legs. You cannot control when you poop (bowel movement) or pee (urinate) You feel sick to your stomach (nauseous) or throw up (vomit) You have belly (abdominal) pain. You feel like you may pass out (faint). If you develop a fever.  Make Sure you: Understand these instructions. Will watch your condition Will get help right away if you are not doing well or get worse.  Your e-visit answers were reviewed by a board certified advanced clinical practitioner to complete your personal care plan.  Depending on the condition, your plan could have included both over the counter or prescription medications.  If there is a problem please reply  once you have received a response from your provider.  Your safety is important to us .  If you have drug allergies check your prescription carefully.    You can use MyChart to ask questions about today's visit, request a non-urgent call back, or ask for a work or school excuse for 24 hours related to this e-Visit. If it has been greater than 24 hours you will need to follow up with your provider, or enter a new e-Visit to address those concerns.  You will get an e-mail in the next two days asking about  your experience.  I hope that your e-visit has been valuable and will speed your recovery. Thank you for using e-visits.    I have spent 5 minutes in review of e-visit questionnaire, review and updating patient chart, medical decision making and response to patient.   Angelia Kelp, PA-C

## 2023-06-04 ENCOUNTER — Telehealth: Admitting: Physician Assistant

## 2023-06-04 DIAGNOSIS — J358 Other chronic diseases of tonsils and adenoids: Secondary | ICD-10-CM

## 2023-06-04 NOTE — Progress Notes (Signed)
  E-Visit for Sore Throat  We are sorry that you are not feeling well.  Here is how we plan to help!  Your symptoms indicate a likely tonsil stone. For throat pain, we recommend over the counter oral pain relief medications such as acetaminophen  or aspirin, or anti-inflammatory medications such as ibuprofen or naproxen  sodium.  Topical treatments such as oral throat lozenges or sprays may be used as needed.   For tonsil stones the key to treatment is making sure you are hydrated well. Also, using sour candies or a lemon can activate the salivary response and help to push the stone out to relieve symptoms.   Home Care: Only take medications as instructed by your medical team. Do not drink alcohol while taking these medications. A steam or ultrasonic humidifier can help congestion.  You can place a towel over your head and breathe in the steam from hot water coming from a faucet. Avoid close contacts especially the very young and the elderly. Cover your mouth when you cough or sneeze. Always remember to wash your hands.  Get Help Right Away If: You develop worsening fever or throat pain. You develop a severe head ache or visual changes. Your symptoms persist after you have completed your treatment plan.  Make sure you Understand these instructions. Will watch your condition. Will get help right away if you are not doing well or get worse.   Thank you for choosing an e-visit.  Your e-visit answers were reviewed by a board certified advanced clinical practitioner to complete your personal care plan. Depending upon the condition, your plan could have included both over the counter or prescription medications.  Please review your pharmacy choice. Make sure the pharmacy is open so you can pick up prescription now. If there is a problem, you may contact your provider through [employer] and have the prescription routed to another pharmacy.  Your safety is important to us . If you have  drug allergies check your prescription carefully.   For the next 24 hours you can use MyChart to ask questions about today's visit, request a non-urgent call back, or ask for a work or school excuse. You will get an email in the next two days asking about your experience. I hope that your e-visit has been valuable and will speed your recovery.     I have spent 5 minutes in review of e-visit questionnaire, review and updating patient chart, medical decision making and response to patient.   Angelia Kelp, PA-C

## 2023-06-16 ENCOUNTER — Telehealth: Admitting: Physician Assistant

## 2023-06-16 DIAGNOSIS — L719 Rosacea, unspecified: Secondary | ICD-10-CM | POA: Diagnosis not present

## 2023-06-16 MED ORDER — METRONIDAZOLE 1 % EX GEL: Freq: Every day | CUTANEOUS | 0 refills | Status: DC

## 2023-06-16 NOTE — Progress Notes (Signed)
 Message sent to patient requesting further input regarding current symptoms. Awaiting patient response.

## 2023-06-16 NOTE — Progress Notes (Signed)
E visit for Rosacea We are sorry that you are not feeling well. Here is how we plan to help! Based on what you shared with me it looks like you have Rosacea.  Rosacea is a common chronic skin condition that usually only affects the face and eyes.  Occasionally, the neck, chest, or other areas may be involved.  Characterized by redness, pimples, and broken blood vessels, rosacea tends to begin after middle age (between the ages of 17 and 32).  It is more common in fair-skinned people and women in menopause. It may appear differently in dark skinned people but rosacea effects all ethnic groups.  The cause of rosacea is not fully understood. We do know that rosacea is worsened by various trigger factors including, spicy or hot foods, hot beverages such as coffee or tea, alcohol, and sun exposure just to name a few.  Signs of rosacea may vary greatly from person to person. In some individuals it may only flare up from time to time.   I have prescribed: A topical antibiotic called Metronidazole 1%.  Apply a thin film to affected area once daily.  Wash your hands before and after use. Make sure your skin is clean and dry.  Rub in gently and completely over the affected areas.   HOME CARE: Keep a record of triggers, such as stress, weather, or certain foods or drinks. Consider limiting hot or spicy foods and alcohol. Always use sunscreen that protects against UVA and UVB rays and has a sun-protecting factor (SPF) of 15 or higher. Avoid putting steroids on the skin sores. Steroids may make rosacea worse.  You may use small amounts of water based cosmetic while using this medication.  Apply cosmetics after cream has dried. If you shave your face, use an electric razor Don't scrub your skin or use sponges, brushes, or other abrasive tools. Doing so can irritate your skin. GET HELP RIGHT AWAY IF: If your rosacea gets worse or is not better within 4 weeks. If a new skin condition or rash develops. Loss of  feeling or tingling of treated area Nausea  MAKE SURE YOU   Understand these instructions. Will watch your condition. Will get help right away if you are not doing well or get worse.   Thank you for choosing an e-visit.  Your e-visit answers were reviewed by a board certified advanced clinical practitioner to complete your personal care plan. Depending upon the condition, your plan could have included both over the counter or prescription medications.  Please review your pharmacy choice. Make sure the pharmacy is open so you can pick up prescription now. If there is a problem, you may contact your provider through [employer] and have the prescription routed to another pharmacy.  Your safety is important to [place]. If you have drug allergies check your prescription carefully.   For the next 24 hours you can use MyChart to ask questions about today's visit, request a non-urgent call back, or ask for a work or school excuse. You will get an email in the next two days asking about your experience. I hope that your e-visit has been valuable and will speed your recovery.  I have spent 5 minutes in review of e-visit questionnaire, review and updating patient chart, medical decision making and response to patient.   Margaretann Loveless, PA-C

## 2023-06-26 DIAGNOSIS — F411 Generalized anxiety disorder: Secondary | ICD-10-CM | POA: Diagnosis not present

## 2023-06-26 DIAGNOSIS — F330 Major depressive disorder, recurrent, mild: Secondary | ICD-10-CM | POA: Diagnosis not present

## 2023-07-13 ENCOUNTER — Telehealth: Admitting: Family Medicine

## 2023-07-13 DIAGNOSIS — N3000 Acute cystitis without hematuria: Secondary | ICD-10-CM | POA: Diagnosis not present

## 2023-07-13 MED ORDER — CEPHALEXIN 500 MG PO CAPS: 500.0000 mg | ORAL_CAPSULE | Freq: Two times a day (BID) | ORAL | 0 refills | Status: AC

## 2023-07-13 NOTE — Progress Notes (Signed)

## 2023-07-31 DIAGNOSIS — F411 Generalized anxiety disorder: Secondary | ICD-10-CM | POA: Diagnosis not present

## 2023-07-31 DIAGNOSIS — F330 Major depressive disorder, recurrent, mild: Secondary | ICD-10-CM | POA: Diagnosis not present

## 2023-08-07 DIAGNOSIS — M79641 Pain in right hand: Secondary | ICD-10-CM | POA: Diagnosis not present

## 2023-08-29 DIAGNOSIS — F330 Major depressive disorder, recurrent, mild: Secondary | ICD-10-CM | POA: Diagnosis not present

## 2023-08-29 DIAGNOSIS — F411 Generalized anxiety disorder: Secondary | ICD-10-CM | POA: Diagnosis not present

## 2023-09-05 DIAGNOSIS — M79672 Pain in left foot: Secondary | ICD-10-CM | POA: Diagnosis not present

## 2023-09-17 ENCOUNTER — Telehealth: Admitting: Physician Assistant

## 2023-09-17 DIAGNOSIS — K219 Gastro-esophageal reflux disease without esophagitis: Secondary | ICD-10-CM | POA: Diagnosis not present

## 2023-09-17 MED ORDER — OMEPRAZOLE 20 MG PO CPDR: 20.0000 mg | DELAYED_RELEASE_CAPSULE | Freq: Every day | ORAL | 0 refills | Status: DC

## 2023-09-17 NOTE — Progress Notes (Signed)
E-Visit for Heartburn  We are sorry that you are not feeling well.  Here is how we plan to help!  Based on what you shared with me it looks like you most likely have Gastroesophageal Reflux Disease (GERD)  Gastroesophageal reflux disease (GERD) happens when acid from your stomach flows up into the esophagus.  When acid comes in contact with the esophagus, the acid causes sorenss (inflammation) in the esophagus.  Over time, GERD may create small holes (ulcers) in the lining of the esophagus.  I have prescribed Omeprazole 20 mg one by mouth daily until you follow up with a provider.  Your symptoms should improve in the next day or two.  You can use antacids as needed until symptoms resolve.  Call us if your heartburn worsens, you have trouble swallowing, weight loss, spitting up blood or recurrent vomiting.  Home Care: May include lifestyle changes such as weight loss, quitting smoking and alcohol consumption Avoid foods and drinks that make your symptoms worse, such as: Caffeine or alcoholic drinks Chocolate Peppermint or mint flavorings Garlic and onions Spicy foods Citrus fruits, such as oranges, lemons, or limes Tomato-based foods such as sauce, chili, salsa and pizza Fried and fatty foods Avoid lying down for 3 hours prior to your bedtime or prior to taking a nap Eat small, frequent meals instead of a large meals Wear loose-fitting clothing.  Do not wear anything tight around your waist that causes pressure on your stomach. Raise the head of your bed 6 to 8 inches with wood blocks to help you sleep.  Extra pillows will not help.  Seek Help Right Away If: You have pain in your arms, neck, jaw, teeth or back Your pain increases or changes in intensity or duration You develop nausea, vomiting or sweating (diaphoresis) You develop shortness of breath or you faint Your vomit is green, yellow, black or looks like coffee grounds or blood Your stool is red, bloody or black  These  symptoms could be signs of other problems, such as heart disease, gastric bleeding or esophageal bleeding.  Make sure you : Understand these instructions. Will watch your condition. Will get help right away if you are not doing well or get worse.  Your e-visit answers were reviewed by a board certified advanced clinical practitioner to complete your personal care plan.  Depending on the condition, your plan could have included both over the counter or prescription medications.  If there is a problem please reply  once you have received a response from your provider.  Your safety is important to [place].  If you have drug allergies check your prescription carefully.    You can use MyChart to ask questions about todays visit, request a non-urgent call back, or ask for a work or school excuse for 24 hours related to this e-Visit. If it has been greater than 24 hours you will need to follow up with your provider, or enter a new e-Visit to address those concerns.  You will get an e-mail in the next two days asking about your experience.  I hope that your e-visit has been valuable and will speed your recovery. Thank you for using e-visits.

## 2023-09-17 NOTE — Progress Notes (Signed)
 I have spent 5 minutes in review of e-visit questionnaire, review and updating patient chart, medical decision making and response to patient.   Elsie Velma Lunger, PA-C

## 2023-09-18 DIAGNOSIS — M79672 Pain in left foot: Secondary | ICD-10-CM | POA: Diagnosis not present

## 2023-09-25 DIAGNOSIS — M79672 Pain in left foot: Secondary | ICD-10-CM | POA: Diagnosis not present

## 2023-10-01 DIAGNOSIS — F330 Major depressive disorder, recurrent, mild: Secondary | ICD-10-CM | POA: Diagnosis not present

## 2023-10-01 DIAGNOSIS — F411 Generalized anxiety disorder: Secondary | ICD-10-CM | POA: Diagnosis not present

## 2023-10-10 DIAGNOSIS — S92215A Nondisplaced fracture of cuboid bone of left foot, initial encounter for closed fracture: Secondary | ICD-10-CM | POA: Diagnosis not present

## 2023-10-17 ENCOUNTER — Telehealth: Admitting: Physician Assistant

## 2023-10-17 DIAGNOSIS — J069 Acute upper respiratory infection, unspecified: Secondary | ICD-10-CM

## 2023-10-17 MED ORDER — BENZONATATE 100 MG PO CAPS
100.0000 mg | ORAL_CAPSULE | Freq: Three times a day (TID) | ORAL | 0 refills | Status: DC | PRN
Start: 2023-10-17 — End: 2023-12-21

## 2023-10-17 NOTE — Progress Notes (Signed)

## 2023-11-10 DIAGNOSIS — S92215A Nondisplaced fracture of cuboid bone of left foot, initial encounter for closed fracture: Secondary | ICD-10-CM | POA: Diagnosis not present

## 2023-11-13 DIAGNOSIS — Z309 Encounter for contraceptive management, unspecified: Secondary | ICD-10-CM | POA: Diagnosis not present

## 2023-11-13 DIAGNOSIS — N920 Excessive and frequent menstruation with regular cycle: Secondary | ICD-10-CM | POA: Diagnosis not present

## 2023-12-18 DIAGNOSIS — F330 Major depressive disorder, recurrent, mild: Secondary | ICD-10-CM | POA: Diagnosis not present

## 2023-12-18 DIAGNOSIS — F411 Generalized anxiety disorder: Secondary | ICD-10-CM | POA: Diagnosis not present

## 2023-12-21 ENCOUNTER — Telehealth: Admitting: Nurse Practitioner

## 2023-12-21 DIAGNOSIS — J069 Acute upper respiratory infection, unspecified: Secondary | ICD-10-CM

## 2023-12-21 DIAGNOSIS — J4521 Mild intermittent asthma with (acute) exacerbation: Secondary | ICD-10-CM

## 2023-12-21 MED ORDER — IPRATROPIUM BROMIDE 0.03 % NA SOLN: 2.0000 | Freq: Two times a day (BID) | NASAL | 0 refills | Status: AC

## 2023-12-21 MED ORDER — IBUPROFEN 600 MG PO TABS: 600.0000 mg | ORAL_TABLET | Freq: Three times a day (TID) | ORAL | 0 refills | Status: DC | PRN

## 2023-12-21 MED ORDER — PREDNISONE 20 MG PO TABS: 20.0000 mg | ORAL_TABLET | Freq: Every day | ORAL | 0 refills | Status: AC

## 2023-12-21 MED ORDER — ALBUTEROL SULFATE HFA 108 (90 BASE) MCG/ACT IN AERS: 2.0000 | INHALATION_SPRAY | Freq: Four times a day (QID) | RESPIRATORY_TRACT | 0 refills | Status: AC | PRN

## 2023-12-21 NOTE — Progress Notes (Signed)

## 2023-12-29 DIAGNOSIS — F419 Anxiety disorder, unspecified: Secondary | ICD-10-CM | POA: Diagnosis not present

## 2024-01-01 DIAGNOSIS — S92215A Nondisplaced fracture of cuboid bone of left foot, initial encounter for closed fracture: Secondary | ICD-10-CM | POA: Diagnosis not present

## 2024-02-01 ENCOUNTER — Telehealth: Admitting: Nurse Practitioner

## 2024-02-01 DIAGNOSIS — J029 Acute pharyngitis, unspecified: Secondary | ICD-10-CM

## 2024-02-01 DIAGNOSIS — J069 Acute upper respiratory infection, unspecified: Secondary | ICD-10-CM

## 2024-02-01 MED ORDER — IBUPROFEN 600 MG PO TABS: 600.0000 mg | ORAL_TABLET | Freq: Three times a day (TID) | ORAL | 0 refills | Status: AC | PRN

## 2024-02-01 MED ORDER — PROMETHAZINE-DM 6.25-15 MG/5ML PO SYRP: 5.0000 mL | ORAL_SOLUTION | Freq: Four times a day (QID) | ORAL | 0 refills | Status: AC | PRN

## 2024-02-01 MED ORDER — LIDOCAINE VISCOUS HCL 2 % MT SOLN: 15.0000 mL | OROMUCOSAL | 0 refills | Status: AC | PRN

## 2024-02-01 NOTE — Progress Notes (Signed)
 We are sorry that you are not feeling well.  Here is how we plan to help!  Your symptoms indicate a likely viral infection (Pharyngitis).   Pharyngitis is inflammation in the back of the throat which can cause a sore throat, scratchiness and sometimes difficulty swallowing.   Pharyngitis is typically caused by a respiratory virus and will just run its course.  Please keep in mind that your symptoms could last up to 10 days.    For throat pain, we recommend over the counter oral pain relief medications such as acetaminophen  or aspirin, or anti-inflammatory medications such as ibuprofen  or naproxen  sodium.  Topical treatments such as oral throat lozenges or sprays may be used as needed. For throat pain, I have prescribed a Viscous Lidocaine  2% solution. Swallow 5-10 mL every 4-6 hours as needed for sore throat. DO NOT eat or drink anything for 15-20 minutes after swallowing to allow the medication to coat the throat.. I have also prescribed a cough syrup for cough and motrin  for pain.  Avoid close contact with loved ones, especially the very young and elderly.  Remember to wash your hands thoroughly throughout the day as this is the number one way to prevent the spread of infection! We also recommend that you periodically wipe down door knobs and counters with disinfectant.  After careful review of your answers, I would not recommend and antibiotic for your condition.  Antibiotics should not be used to treat conditions that we suspect are caused by viruses like the virus that causes the common cold or flu.  Providers prescribe antibiotics to treat infections caused by bacteria. Antibiotics are very powerful in treating bacterial infections when they are used properly.  To maintain their effectiveness, they should be used only when necessary.  Overuse of antibiotics has resulted in the development of super bugs that are resistant to treatment!    Some people with strep throat, however, can have atypical  symptoms. As such, if anything continued to progress despite treatment recommendations, you may need formal testing in clinic or office.  Home Care: Only take medications as instructed by your medical team. Do not drink alcohol while taking these medications. A steam or ultrasonic humidifier can help congestion.  You can place a towel over your head and breathe in the steam from hot water coming from a faucet. Avoid close contacts especially the very young and the elderly. Cover your mouth when you cough or sneeze. Always remember to wash your hands.  Get Help Right Away If: You develop worsening fever or throat pain. You develop a severe head ache or visual changes. Your symptoms persist after you have completed your treatment plan.  Make sure you Understand these instructions. Will watch your condition. Will get help right away if you are not doing well or get worse.  Your e-visit answers were reviewed by a board certified advanced clinical practitioner to complete your personal care plan.  Depending on the condition, your plan could have included both over the counter or prescription medications.  If there is a problem please reply once you have received a response from your provider.  Your safety is important to us .  If you have drug allergies check your prescription carefully.    You can use MyChart to ask questions about todays visit, request a non-urgent call back, or ask for a work or school excuse for 24 hours related to this e-Visit. If it has been greater than 24 hours you will need to follow up  with your provider, or enter a new e-Visit to address those concerns.  You will get an e-mail in the next two days asking about your experience.  I hope that your e-visit has been valuable and will speed your recovery. Thank you for using e-visits.   I have spent 5 minutes in review of e-visit questionnaire, review and updating patient chart, medical decision making and response to  patient.   Reynald Woods W Inesha Sow, NP

## 2024-03-04 ENCOUNTER — Telehealth: Admitting: Physician Assistant

## 2024-03-04 DIAGNOSIS — H9201 Otalgia, right ear: Secondary | ICD-10-CM

## 2024-03-04 NOTE — Progress Notes (Signed)
" °  Because of severity of pain (10/10), I feel your condition warrants further evaluation and I recommend that you be seen in a face-to-face visit.   NOTE: There will be NO CHARGE for this E-Visit   If you are having a true medical emergency, please call 911.     For an urgent face to face visit, [facility] has multiple urgent care centers for your convenience.  Click the link below for the full list of locations and hours, walk-in wait times, appointment scheduling options and driving directions:  Urgent Care - [place], [place], [place], [place], [place], [place]  [place]     Your MyChart E-visit questionnaire answers were reviewed by a board certified advanced clinical practitioner to complete your personal care plan based on your specific symptoms.    Thank you for using e-Visits.    "
